# Patient Record
Sex: Male | Born: 1975
Health system: Southern US, Community
[De-identification: ages and names within clinical notes are randomized; demographics above are authoritative.]

## PROBLEM LIST (undated history)

## (undated) DIAGNOSIS — E119 Type 2 diabetes mellitus without complications: Secondary | ICD-10-CM

---

## 2019-02-26 ENCOUNTER — Emergency Department (HOSPITAL_COMMUNITY): Payer: Medicare PPO

## 2019-02-26 ENCOUNTER — Emergency Department (HOSPITAL_COMMUNITY)
Admission: EM | Admit: 2019-02-26 | Discharge: 2019-02-26 | Disposition: A | Discharge status: Home / Self Care | Payer: Medicare PPO | Attending: Emergency Medicine | Admitting: Emergency Medicine

## 2019-02-26 ENCOUNTER — Encounter (HOSPITAL_COMMUNITY): Payer: Self-pay | Admitting: Emergency Medicine

## 2019-02-26 ENCOUNTER — Other Ambulatory Visit: Payer: Self-pay

## 2019-02-26 DIAGNOSIS — Y9289 Other specified places as the place of occurrence of the external cause: Secondary | ICD-10-CM | POA: Diagnosis not present

## 2019-02-26 DIAGNOSIS — W19XXXA Unspecified fall, initial encounter: Secondary | ICD-10-CM

## 2019-02-26 DIAGNOSIS — Y9301 Activity, walking, marching and hiking: Secondary | ICD-10-CM | POA: Insufficient documentation

## 2019-02-26 DIAGNOSIS — S6392XA Sprain of unspecified part of left wrist and hand, initial encounter: Secondary | ICD-10-CM | POA: Diagnosis not present

## 2019-02-26 DIAGNOSIS — W010XXA Fall on same level from slipping, tripping and stumbling without subsequent striking against object, initial encounter: Secondary | ICD-10-CM | POA: Diagnosis not present

## 2019-02-26 DIAGNOSIS — Y998 Other external cause status: Secondary | ICD-10-CM | POA: Insufficient documentation

## 2019-02-26 DIAGNOSIS — S63502A Unspecified sprain of left wrist, initial encounter: Secondary | ICD-10-CM

## 2019-02-26 DIAGNOSIS — S6992XA Unspecified injury of left wrist, hand and finger(s), initial encounter: Secondary | ICD-10-CM | POA: Diagnosis present

## 2019-02-26 MED ORDER — IBUPROFEN 400 MG PO TABS
600.0000 mg | ORAL_TABLET | Freq: Once | ORAL | Status: AC
  Administered 2019-02-26: 600 mg via ORAL
  Filled 2019-02-26: qty 1

## 2019-02-26 MED ORDER — NAPROXEN 500 MG PO TABS: 500.0000 mg | ORAL_TABLET | Freq: Two times a day (BID) | ORAL | 0 refills | Status: DC

## 2019-02-26 NOTE — ED Notes (Signed)
Discharge instructions discussed with Pt. Pt verbalized understanding. Pt stable and ambulatory.    

## 2019-02-26 NOTE — Discharge Instructions (Addendum)
Your x-ray shows now fracture or dislocation of the wrist or hand. Follow up with your doctor or return here as needed for worsening symptoms.

## 2019-02-26 NOTE — ED Provider Notes (Signed)
[facility] EMERGENCY DEPARTMENT Provider Note   CSN: [id number] Arrival date & time: 02/26/19  1458    History   Chief Complaint Chief Complaint  Patient presents with  . Wrist Pain    HPI Mathew Wallace is a 43 y.o. male who presents to the ED with c/o left hand and wrist pain. Patient reports he was walking and didn't see a small hole and fell and landed on his left hand. The injury occurred last night. Patient has not taken anything for pain. He reports that this morning the pain and swelling is worse than it was last night.      The history is provided by the patient. History limited by: sign language. A language interpreter was used.  Wrist Pain  This is a new problem. The current episode started yesterday. The problem occurs constantly. The problem has been gradually worsening. Exacerbated by: movement of wrist. Relieved by: nothing tried. He has tried nothing for the symptoms.    History reviewed. No pertinent past medical history.  There are no active problems to display for this patient.   History reviewed. No pertinent surgical history.      Home Medications    Prior to Admission medications   Medication Sig Start Date End Date Taking? Authorizing Provider  naproxen (NAPROSYN) 500 MG tablet Take 1 tablet (500 mg total) by mouth 2 (two) times daily. 02/26/19   Janne Napoleon, NP    Family History No family history on file.  Social History Social History   Tobacco Use  . Smoking status: Never Smoker  . Smokeless tobacco: Never Used  Substance Use Topics  . Alcohol use: Not Currently  . Drug use: Not Currently     Allergies   Patient has no known allergies.   Review of Systems Review of Systems  Musculoskeletal: Positive for arthralgias and joint swelling.       Wrist pain  All other systems reviewed and are negative.    Physical Exam Updated Vital Signs BP 125/70 (BP Location: Right Arm)   Pulse 95   Temp 100 F (37.8 C)  (Oral)   Resp 18   SpO2 99%   Physical Exam Vitals signs and nursing note reviewed.  Constitutional:      General: He is not in acute distress.    Appearance: He is well-developed.  HENT:     Head: Normocephalic.     Nose: No congestion.     Mouth/Throat:     Mouth: Mucous membranes are moist.  Eyes:     Conjunctiva/sclera: Conjunctivae normal.  Neck:     Musculoskeletal: Neck supple.  Cardiovascular:     Rate and Rhythm: Normal rate.  Pulmonary:     Effort: Pulmonary effort is normal.  Musculoskeletal:     Left wrist: He exhibits tenderness, bony tenderness and swelling. He exhibits no deformity. Decreased range of motion: due to pain.     Left hand: He exhibits tenderness and swelling. He exhibits normal capillary refill, no deformity and no laceration. Normal sensation noted. Normal strength noted. He exhibits no thumb/finger opposition.  Skin:    General: Skin is warm and dry.  Neurological:     Mental Status: He is alert and oriented to person, place, and time.  Psychiatric:        Mood and Affect: Mood normal.      ED Treatments / Results  Labs (all labs ordered are listed, but only abnormal results are displayed) Labs Reviewed -  No data to display Radiology Dg Wrist Complete Left  Result Date: 02/26/2019 CLINICAL DATA:  43 year old male status post fall landing on the left hand. Left hand pain and swelling. EXAM: LEFT WRIST - COMPLETE 3+ VIEW COMPARISON:  Concurrently obtained radiographs of the left hand FINDINGS: Diffuse soft tissue swelling about the hand and wrist. No acute fracture or malalignment identified. Minimal degenerative change present at the thumb [facility] joint. IMPRESSION: Soft tissue swelling without fracture or malalignment identified. Mild CMC joint osteoarthritis. Electronically Signed   By: Malachy Moan M.D.   On: 02/26/2019 16:54   Dg Hand Complete Left  Result Date: 02/26/2019 CLINICAL DATA:  43 year old male with left hand and wrist pain  after suffering a fall EXAM: LEFT HAND - COMPLETE 3+ VIEW COMPARISON:  Concurrently obtained radiographs of the wrist FINDINGS: Extensive soft tissue swelling over the dorsal aspect of the hand. No fracture or malalignment identified. Very mild degenerative change present at the thumb [facility] joint. IMPRESSION: 1. Diffuse soft tissue swelling without acute fracture or malalignment. 2. 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 Electronically Signed   By: Malachy Moan M.D.   On: 02/26/2019 16:55    Procedures Procedures (including critical care time)  Medications Ordered in ED Medications  ibuprofen (ADVIL,MOTRIN) tablet 600 mg (600 mg Oral Given 02/26/19 1628)     Initial Impression / Assessment and Plan / ED Course  I have reviewed the triage vital signs and the nursing notes. 43 y.o. male here with left wrist and hand pain s/p fall stable for d/c without fracture or dislocation noted on x-ray. No focal neuro deficits. Wrist splint applied, ice, elevation, pain management. Return precautions discussed.   Final Clinical Impressions(s) / ED Diagnoses   Final diagnoses:  Left wrist sprain, initial encounter  Fall, initial encounter    ED Discharge Orders         Ordered    naproxen (NAPROSYN) 500 MG tablet  2 times daily     02/26/19 1712           Kerrie Buffalo [place], [place] 02/26/19 1715    Geoffery Lyons, MD 03/03/19 2328

## 2019-02-26 NOTE — ED Triage Notes (Signed)
Pt states that he fell and landed on his left hand and is having pain and swelling to his left wrist and hand. Sign language interpreter used for triage

## 2019-03-06 ENCOUNTER — Emergency Department (HOSPITAL_COMMUNITY)
Admission: EM | Admit: 2019-03-06 | Discharge: 2019-03-06 | Disposition: A | Discharge status: Home / Self Care | Payer: Medicare PPO | Attending: Emergency Medicine | Admitting: Emergency Medicine

## 2019-03-06 ENCOUNTER — Other Ambulatory Visit: Payer: Self-pay

## 2019-03-06 ENCOUNTER — Emergency Department (HOSPITAL_COMMUNITY): Payer: Medicare PPO

## 2019-03-06 DIAGNOSIS — Z79899 Other long term (current) drug therapy: Secondary | ICD-10-CM | POA: Insufficient documentation

## 2019-03-06 DIAGNOSIS — M5442 Lumbago with sciatica, left side: Secondary | ICD-10-CM

## 2019-03-06 DIAGNOSIS — M79605 Pain in left leg: Secondary | ICD-10-CM | POA: Diagnosis present

## 2019-03-06 MED ORDER — IBUPROFEN 800 MG PO TABS: 800.0000 mg | ORAL_TABLET | Freq: Three times a day (TID) | ORAL | 0 refills | Status: AC | PRN

## 2019-03-06 MED ORDER — IBUPROFEN 400 MG PO TABS
600.0000 mg | ORAL_TABLET | Freq: Once | ORAL | Status: AC
  Administered 2019-03-06: 600 mg via ORAL
  Filled 2019-03-06: qty 1

## 2019-03-06 MED ORDER — OXYCODONE-ACETAMINOPHEN 5-325 MG PO TABS
1.0000 | ORAL_TABLET | Freq: Once | ORAL | Status: AC
  Administered 2019-03-06: 1 via ORAL
  Filled 2019-03-06: qty 1

## 2019-03-06 MED ORDER — PREDNISONE 20 MG PO TABS: 60.0000 mg | ORAL_TABLET | Freq: Every day | ORAL | 0 refills | Status: DC

## 2019-03-06 MED ORDER — PREDNISONE 20 MG PO TABS
60.0000 mg | ORAL_TABLET | Freq: Once | ORAL | Status: AC
  Administered 2019-03-06: 60 mg via ORAL
  Filled 2019-03-06: qty 3

## 2019-03-06 NOTE — Discharge Instructions (Addendum)
You were seen in the emergency department for low back pain radiating down your left leg.  Your x-rays did not show any obvious fractures.  This is likely sciatica or pinched nerve that is causing her pain.  We are prescribing you some anti-inflammatory medicine to see if this will help limit your symptoms.  You will be important that you follow-up with a primary care doctor and please return to the emergency department if any worsening pain or further neurologic symptoms.

## 2019-03-06 NOTE — ED Triage Notes (Addendum)
Pt c/o left lower back pain radiating to the left leg that began  2 days ago ; pt denies any recent injury ; pt deaf ; pt had a recent fracture to the left arm and was seen for it last week

## 2019-03-06 NOTE — ED Notes (Signed)
Pt going back via [facility]

## 2019-03-06 NOTE — ED Notes (Signed)
Pt ambulated around 50 feet with steady gait and no assistance

## 2019-03-06 NOTE — ED Provider Notes (Signed)
[facility] EMERGENCY DEPARTMENT Provider Note   CSN: [id number] Arrival date & time: 03/06/19  1443    History   Chief Complaint Chief Complaint  Patient presents with  . Leg Pain    HPI Mathew Wallace is a 43 y.o. male.  Is a history of deafness.  He was here on the 18th after a fall the night before.  He injured his left hand and wrist.  Starting a couple of days ago his left leg gave out on him and he collapsed to the ground.  His back is been hurting since then radiating down the posterior side of his left leg down to the sole of his foot.  That caused him difficulty walking.  He is tried nothing for this.  Says it never happened before but then he does remember that he had a pinched nerve at one point that was down his left leg.  No headache fevers chills nausea vomiting diarrhea.  No incontinence of bowel or bladder.  No numbness.     The history is provided by the patient.  Back Pain  Location:  Lumbar spine Quality:  Aching Radiates to:  L posterior upper leg and L foot Pain severity:  Moderate Pain is:  Same all the time Onset quality:  Gradual Timing:  Constant Progression:  Unchanged Chronicity:  New Context: falling and recent injury   Relieved by:  None tried Worsened by:  Ambulation Ineffective treatments:  Bed rest Associated symptoms: leg pain   Associated symptoms: no abdominal pain, no bladder incontinence, no bowel incontinence, no chest pain, no dysuria, no fever, no numbness, no paresthesias, no perianal numbness, no tingling and no weakness     No past medical history on file.  There are no active problems to display for this patient.   No past surgical history on file.      Home Medications    Prior to Admission medications   Medication Sig Start Date End Date Taking? Authorizing Provider  naproxen (NAPROSYN) 500 MG tablet Take 1 tablet (500 mg total) by mouth 2 (two) times daily. 02/26/19   Janne Napoleon, NP    Family History  No family history on file.  Social History Social History   Tobacco Use  . Smoking status: Never Smoker  . Smokeless tobacco: Never Used  Substance Use Topics  . Alcohol use: Not Currently  . Drug use: Not Currently     Allergies   Patient has no known allergies.   Review of Systems Review of Systems  Constitutional: Negative for fever.  HENT: Positive for hearing loss. Negative for sore throat.   Eyes: Negative for visual disturbance.  Respiratory: Negative for shortness of breath.   Cardiovascular: Negative for chest pain.  Gastrointestinal: Negative for abdominal pain and bowel incontinence.  Genitourinary: Negative for bladder incontinence and dysuria.  Musculoskeletal: Positive for back pain.  Skin: Negative for rash.  Neurological: Negative for tingling, weakness, numbness and paresthesias.     Physical Exam Updated Vital Signs BP 137/83   Pulse 96   Temp 99.1 F (37.3 C) (Oral)   Resp 17   Ht 5\' 11"  (1.803 m)   Wt 97.5 kg   SpO2 99%   BMI 29.99 kg/m   Physical Exam Vitals signs and nursing note reviewed.  Constitutional:      Appearance: He is well-developed.  HENT:     Head: Normocephalic and atraumatic.  Eyes:     Conjunctiva/sclera: Conjunctivae normal.  Neck:  Musculoskeletal: Neck supple.  Cardiovascular:     Rate and Rhythm: Normal rate and regular rhythm.     Heart sounds: No murmur.  Pulmonary:     Effort: Pulmonary effort is normal. No respiratory distress.     Breath sounds: Normal breath sounds.  Abdominal:     Palpations: Abdomen is soft.     Tenderness: There is no abdominal tenderness.  Musculoskeletal: Normal range of motion.        General: Tenderness present.     Comments: He is got some tenderness of his midline and diffuse paralumbar tenderness no step-offs.. He is nontender at his hip knee or ankle.  He is got normal flexion and extension of his foot.  Sensation intact to light touch.  He has pain and limitations when  he tries to flex at his hip.  This aggravates his back pain.  Skin:    General: Skin is warm and dry.     Capillary Refill: Capillary refill takes less than 2 seconds.  Neurological:     General: No focal deficit present.     Mental Status: He is alert and oriented to person, place, and time. Mental status is at baseline.     Sensory: No sensory deficit.     Motor: No weakness.     Gait: Gait abnormal.      ED Treatments / Results  Labs (all labs ordered are listed, but only abnormal results are displayed) Labs Reviewed - No data to display  EKG None  Radiology Dg Lumbar Spine Complete  Result Date: 03/06/2019 CLINICAL DATA:  Recent fall with low back pain, initial encounter EXAM: LUMBAR SPINE - COMPLETE 4+ VIEW COMPARISON:  None. FINDINGS: Five lumbar type vertebral bodies are well visualized. There is a partially lumbarized first sacral segment. No pars defects are noted. No anterolisthesis is seen. Mild disc space narrowing at L4-5 and L5-S1 is noted. No acute soft tissue abnormality is noted. IMPRESSION: Mild degenerative changes of lumbar spine without acute abnormality Electronically Signed   By: Alcide CleverMark  Lukens M.D.   On: 03/06/2019 16:20    Procedures Procedures (including critical care time)  Medications Ordered in ED Medications  oxyCODONE-acetaminophen (PERCOCET/ROXICET) 5-325 MG per tablet 1 tablet (has no administration in time range)  ibuprofen (ADVIL,MOTRIN) tablet 600 mg (has no administration in time range)  predniSONE (DELTASONE) tablet 60 mg (has no administration in time range)     Initial Impression / Assessment and Plan / ED Course  I have reviewed the triage vital signs and the nursing notes.  Pertinent labs & imaging results that were available during my care of the patient were reviewed by me and considered in my medical decision making (see chart for details).  Clinical Course as of Mar 06 2227  Sat Mar 06, 2019  [id number] 43 year old male with deafness  here with low back pain and pain radiating down his left leg leading to some weakness.  I do not appreciate any real weakness on exam but pain does make him give way.  No numbness no cauda equina signs.  We will treat him with some anti-inflammatories and pain medicine and check some imaging.   [MB]  1600 History and exam assisted by ASL interpreter on iPad.   [MB]  1623 Patient's imaging shows DJD but no acute fractures.  Will attempt to ambulate.   [MB]  1727 Patient did fairly well ambulating.  Will discharge on pain medicine NSAIDs and steroids and have follow-up PCP.   [MB]  Clinical Course User Index [MB] Terrilee Files, MD        Final Clinical Impressions(s) / ED Diagnoses   Final diagnoses:  Acute midline low back pain with left-sided sciatica    ED Discharge Orders         Ordered    ibuprofen (ADVIL,MOTRIN) 800 MG tablet  Every 8 hours PRN     03/06/19 1729    predniSONE (DELTASONE) 20 MG tablet  Daily     03/06/19 1729           Terrilee Files, MD 03/06/19 2229

## 2019-03-09 MED FILL — IBUPROFEN 800 MG TAB: 800 | 10 days supply | Qty: 30 | Fill #0

## 2019-03-09 MED FILL — NAPROXEN 500 MG TABLET: 500 | 10 days supply | Qty: 20 | Fill #0

## 2019-03-09 MED FILL — predniSONE 20 MG TABS: 20 | 4 days supply | Qty: 12 | Fill #0

## 2019-03-10 ENCOUNTER — Other Ambulatory Visit: Payer: Self-pay | Admitting: Critical Care Medicine

## 2019-03-21 ENCOUNTER — Emergency Department (HOSPITAL_COMMUNITY): Payer: Medicare PPO

## 2019-03-21 ENCOUNTER — Inpatient Hospital Stay (HOSPITAL_COMMUNITY)
Admission: EM | Admit: 2019-03-21 | Discharge: 2019-03-23 | DRG: 638 | Disposition: A | Discharge status: Home / Self Care | Payer: Medicare PPO | Attending: Cardiovascular Disease | Admitting: Cardiovascular Disease

## 2019-03-21 DIAGNOSIS — E1165 Type 2 diabetes mellitus with hyperglycemia: Principal | ICD-10-CM | POA: Diagnosis present

## 2019-03-21 DIAGNOSIS — R0789 Other chest pain: Secondary | ICD-10-CM

## 2019-03-21 DIAGNOSIS — Z833 Family history of diabetes mellitus: Secondary | ICD-10-CM

## 2019-03-21 DIAGNOSIS — I249 Acute ischemic heart disease, unspecified: Secondary | ICD-10-CM | POA: Diagnosis present

## 2019-03-21 DIAGNOSIS — Z590 Homelessness: Secondary | ICD-10-CM

## 2019-03-21 DIAGNOSIS — R918 Other nonspecific abnormal finding of lung field: Secondary | ICD-10-CM | POA: Diagnosis present

## 2019-03-21 DIAGNOSIS — Z91128 Patient's intentional underdosing of medication regimen for other reason: Secondary | ICD-10-CM

## 2019-03-21 DIAGNOSIS — R079 Chest pain, unspecified: Secondary | ICD-10-CM

## 2019-03-21 DIAGNOSIS — R590 Localized enlarged lymph nodes: Secondary | ICD-10-CM | POA: Diagnosis present

## 2019-03-21 DIAGNOSIS — Z7952 Long term (current) use of systemic steroids: Secondary | ICD-10-CM

## 2019-03-21 DIAGNOSIS — T383X6A Underdosing of insulin and oral hypoglycemic [antidiabetic] drugs, initial encounter: Secondary | ICD-10-CM | POA: Diagnosis present

## 2019-03-21 DIAGNOSIS — R778 Other specified abnormalities of plasma proteins: Secondary | ICD-10-CM

## 2019-03-21 DIAGNOSIS — Z791 Long term (current) use of non-steroidal anti-inflammatories (NSAID): Secondary | ICD-10-CM

## 2019-03-21 DIAGNOSIS — H9190 Unspecified hearing loss, unspecified ear: Secondary | ICD-10-CM | POA: Diagnosis present

## 2019-03-21 DIAGNOSIS — R7989 Other specified abnormal findings of blood chemistry: Secondary | ICD-10-CM

## 2019-03-21 DIAGNOSIS — X58XXXA Exposure to other specified factors, initial encounter: Secondary | ICD-10-CM | POA: Diagnosis present

## 2019-03-21 DIAGNOSIS — T464X6A Underdosing of angiotensin-converting-enzyme inhibitors, initial encounter: Secondary | ICD-10-CM | POA: Diagnosis present

## 2019-03-21 DIAGNOSIS — I213 ST elevation (STEMI) myocardial infarction of unspecified site: Secondary | ICD-10-CM

## 2019-03-21 HISTORY — DX: Type 2 diabetes mellitus without complications: E11.9

## 2019-03-21 MED ORDER — ASPIRIN 81 MG PO CHEW: 324.0000 mg | CHEWABLE_TABLET | Freq: Once | ORAL | Status: DC

## 2019-03-21 MED ORDER — SODIUM CHLORIDE 0.9 % IV SOLN
INTRAVENOUS | Status: DC
  Administered 2019-03-21: via INTRAVENOUS

## 2019-03-21 MED ORDER — MORPHINE SULFATE (PF) 4 MG/ML IV SOLN
4.0000 mg | Freq: Once | INTRAVENOUS | Status: AC
  Administered 2019-03-21: 4 mg via INTRAVENOUS
  Filled 2019-03-21: qty 1

## 2019-03-21 MED ORDER — HEPARIN SODIUM (PORCINE) 5000 UNIT/ML IJ SOLN
4000.0000 [IU] | Freq: Once | INTRAMUSCULAR | Status: AC
  Administered 2019-03-21: 4000 [IU] via INTRAVENOUS

## 2019-03-22 ENCOUNTER — Inpatient Hospital Stay (HOSPITAL_COMMUNITY): Payer: Medicare PPO

## 2019-03-22 ENCOUNTER — Encounter (HOSPITAL_COMMUNITY): Payer: Self-pay | Admitting: Emergency Medicine

## 2019-03-22 ENCOUNTER — Other Ambulatory Visit: Payer: Self-pay

## 2019-03-22 ENCOUNTER — Inpatient Hospital Stay (HOSPITAL_COMMUNITY)
Admission: EM | Disposition: A | Discharge status: Home / Self Care | Payer: Self-pay | Source: Home / Self Care | Attending: Cardiovascular Disease

## 2019-03-22 DIAGNOSIS — T383X6A Underdosing of insulin and oral hypoglycemic [antidiabetic] drugs, initial encounter: Secondary | ICD-10-CM | POA: Diagnosis present

## 2019-03-22 DIAGNOSIS — T464X6A Underdosing of angiotensin-converting-enzyme inhibitors, initial encounter: Secondary | ICD-10-CM | POA: Diagnosis present

## 2019-03-22 DIAGNOSIS — R7989 Other specified abnormal findings of blood chemistry: Secondary | ICD-10-CM

## 2019-03-22 DIAGNOSIS — R9431 Abnormal electrocardiogram [ECG] [EKG]: Secondary | ICD-10-CM

## 2019-03-22 DIAGNOSIS — Z791 Long term (current) use of non-steroidal anti-inflammatories (NSAID): Secondary | ICD-10-CM | POA: Diagnosis not present

## 2019-03-22 DIAGNOSIS — Z590 Homelessness: Secondary | ICD-10-CM | POA: Diagnosis not present

## 2019-03-22 DIAGNOSIS — R918 Other nonspecific abnormal finding of lung field: Secondary | ICD-10-CM

## 2019-03-22 DIAGNOSIS — Z91128 Patient's intentional underdosing of medication regimen for other reason: Secondary | ICD-10-CM | POA: Diagnosis not present

## 2019-03-22 DIAGNOSIS — Z7952 Long term (current) use of systemic steroids: Secondary | ICD-10-CM | POA: Diagnosis not present

## 2019-03-22 DIAGNOSIS — R0789 Other chest pain: Secondary | ICD-10-CM | POA: Diagnosis not present

## 2019-03-22 DIAGNOSIS — H9190 Unspecified hearing loss, unspecified ear: Secondary | ICD-10-CM | POA: Diagnosis present

## 2019-03-22 DIAGNOSIS — I249 Acute ischemic heart disease, unspecified: Secondary | ICD-10-CM | POA: Diagnosis present

## 2019-03-22 DIAGNOSIS — X58XXXA Exposure to other specified factors, initial encounter: Secondary | ICD-10-CM | POA: Diagnosis present

## 2019-03-22 DIAGNOSIS — Z833 Family history of diabetes mellitus: Secondary | ICD-10-CM | POA: Diagnosis not present

## 2019-03-22 DIAGNOSIS — R590 Localized enlarged lymph nodes: Secondary | ICD-10-CM | POA: Diagnosis present

## 2019-03-22 DIAGNOSIS — R079 Chest pain, unspecified: Secondary | ICD-10-CM | POA: Diagnosis present

## 2019-03-22 DIAGNOSIS — R778 Other specified abnormalities of plasma proteins: Secondary | ICD-10-CM

## 2019-03-22 DIAGNOSIS — E1165 Type 2 diabetes mellitus with hyperglycemia: Principal | ICD-10-CM | POA: Diagnosis present

## 2019-03-22 HISTORY — PX: LEFT HEART CATH AND CORONARY ANGIOGRAPHY: CATH118249

## 2019-03-22 LAB — APTT: aPTT: 32 seconds (ref 24–36)

## 2019-03-22 LAB — CBC WITH DIFFERENTIAL/PLATELET
Abs Immature Granulocytes: 0.03 10*3/uL (ref 0.00–0.07)
Basophils Absolute: 0 10*3/uL (ref 0.0–0.1)
Basophils Relative: 0 %
Eosinophils Absolute: 0.1 10*3/uL (ref 0.0–0.5)
Eosinophils Relative: 2 %
HCT: 31.8 % — ABNORMAL LOW (ref 39.0–52.0)
Hemoglobin: 10.4 g/dL — ABNORMAL LOW (ref 13.0–17.0)
Immature Granulocytes: 0 %
Lymphocytes Relative: 29 %
Lymphs Abs: 2 10*3/uL (ref 0.7–4.0)
MCH: 25.7 pg — ABNORMAL LOW (ref 26.0–34.0)
MCHC: 32.7 g/dL (ref 30.0–36.0)
MCV: 78.7 fL — ABNORMAL LOW (ref 80.0–100.0)
Monocytes Absolute: 0.5 10*3/uL (ref 0.1–1.0)
Monocytes Relative: 7 %
Neutro Abs: 4.4 10*3/uL (ref 1.7–7.7)
Neutrophils Relative %: 62 %
Platelets: 325 10*3/uL (ref 150–400)
RBC: 4.04 MIL/uL — ABNORMAL LOW (ref 4.22–5.81)
RDW: 13.7 % (ref 11.5–15.5)
WBC: 7.1 10*3/uL (ref 4.0–10.5)
nRBC: 0 % (ref 0.0–0.2)

## 2019-03-22 LAB — HEMOGLOBIN A1C
Hgb A1c MFr Bld: 11.1 % — ABNORMAL HIGH (ref 4.8–5.6)
Mean Plasma Glucose: 271.87 mg/dL

## 2019-03-22 LAB — COMPREHENSIVE METABOLIC PANEL
ALT: 16 U/L (ref 0–44)
AST: 19 U/L (ref 15–41)
Albumin: 2.5 g/dL — ABNORMAL LOW (ref 3.5–5.0)
Alkaline Phosphatase: 68 U/L (ref 38–126)
Anion gap: 12 (ref 5–15)
BUN: 21 mg/dL — ABNORMAL HIGH (ref 6–20)
CO2: 24 mmol/L (ref 22–32)
Calcium: 8.7 mg/dL — ABNORMAL LOW (ref 8.9–10.3)
Chloride: 93 mmol/L — ABNORMAL LOW (ref 98–111)
Creatinine, Ser: 1.05 mg/dL (ref 0.61–1.24)
GFR calc Af Amer: >60 mL/min (ref >60)
GFR calc non Af Amer: >60 mL/min (ref >60)
Glucose, Bld: 348 mg/dL — ABNORMAL HIGH (ref 70–99)
Potassium: 3.8 mmol/L (ref 3.5–5.1)
Sodium: 129 mmol/L — ABNORMAL LOW (ref 135–145)
Total Bilirubin: 0.4 mg/dL (ref 0.3–1.2)
Total Protein: 7.6 g/dL (ref 6.5–8.1)

## 2019-03-22 LAB — RAPID URINE DRUG SCREEN, HOSP PERFORMED
Amphetamines: NOT DETECTED
Barbiturates: NOT DETECTED
Benzodiazepines: NOT DETECTED
Cocaine: NOT DETECTED
Opiates: POSITIVE — AB
Tetrahydrocannabinol: NOT DETECTED

## 2019-03-22 LAB — LIPID PANEL
Cholesterol: 179 mg/dL (ref 0–200)
HDL: 45 mg/dL (ref >40)
LDL Cholesterol: 113 mg/dL — ABNORMAL HIGH (ref 0–99)
Total CHOL/HDL Ratio: 4 RATIO
Triglycerides: 107 mg/dL (ref <150)
VLDL: 21 mg/dL (ref 0–40)

## 2019-03-22 LAB — ECHOCARDIOGRAM COMPLETE
Height: 71 in
Weight: 3200 oz

## 2019-03-22 LAB — PROTIME-INR
INR: 1.2 (ref 0.8–1.2)
Prothrombin Time: 14.6 seconds (ref 11.4–15.2)

## 2019-03-22 LAB — TROPONIN I
Troponin I: 0.14 ng/mL (ref <0.03)
Troponin I: 0.15 ng/mL (ref <0.03)

## 2019-03-22 LAB — POCT I-STAT CREATININE: Creatinine, Ser: 0.7 mg/dL (ref 0.61–1.24)

## 2019-03-22 LAB — D-DIMER, QUANTITATIVE: D-Dimer, Quant: 1.28 ug/mL-FEU — ABNORMAL HIGH (ref 0.00–0.50)

## 2019-03-22 LAB — GLUCOSE, CAPILLARY
Glucose-Capillary: 365 mg/dL — ABNORMAL HIGH (ref 70–99)
Glucose-Capillary: 310 mg/dL — ABNORMAL HIGH (ref 70–99)
Glucose-Capillary: 266 mg/dL — ABNORMAL HIGH (ref 70–99)

## 2019-03-22 LAB — T4, FREE: Free T4: 1.27 ng/dL (ref 0.82–1.77)

## 2019-03-22 LAB — HIV ANTIBODY (ROUTINE TESTING W REFLEX): HIV Screen 4th Generation wRfx: NONREACTIVE

## 2019-03-22 LAB — TSH: TSH: 3.506 u[IU]/mL (ref 0.350–4.500)

## 2019-03-22 SURGERY — LEFT HEART CATH AND CORONARY ANGIOGRAPHY
Anesthesia: LOCAL

## 2019-03-22 MED ORDER — HEPARIN SODIUM (PORCINE) 5000 UNIT/ML IJ SOLN
5000.0000 [IU] | Freq: Three times a day (TID) | INTRAMUSCULAR | Status: DC
  Administered 2019-03-22: 22:00:00 5000 [IU] via SUBCUTANEOUS
  Filled 2019-03-22: qty 1

## 2019-03-22 MED ORDER — ONDANSETRON HCL 4 MG/2ML IJ SOLN: 4.0000 mg | Freq: Four times a day (QID) | INTRAMUSCULAR | Status: DC | PRN

## 2019-03-22 MED ORDER — INSULIN ASPART 100 UNIT/ML ~~LOC~~ SOLN
0.0000 [IU] | Freq: Three times a day (TID) | SUBCUTANEOUS | Status: DC
  Administered 2019-03-22: 7 [IU] via SUBCUTANEOUS
  Administered 2019-03-22: 9 [IU] via SUBCUTANEOUS
  Administered 2019-03-23: 2 [IU] via SUBCUTANEOUS

## 2019-03-22 MED ORDER — IOHEXOL 350 MG/ML SOLN
75.0000 mL | Freq: Once | INTRAVENOUS | Status: AC | PRN
  Administered 2019-03-22: 06:00:00 75 mL via INTRAVENOUS

## 2019-03-22 MED ORDER — SODIUM CHLORIDE 0.9 % IV SOLN: INTRAVENOUS | Status: DC

## 2019-03-22 MED ORDER — SODIUM CHLORIDE 0.9 % IV SOLN: 250.0000 mL | INTRAVENOUS | Status: DC | PRN

## 2019-03-22 MED ORDER — LIDOCAINE HCL (PF) 1 % IJ SOLN
INTRAMUSCULAR | Status: AC
  Filled 2019-03-22: qty 30

## 2019-03-22 MED ORDER — IOHEXOL 350 MG/ML SOLN
INTRAVENOUS | Status: DC | PRN
  Administered 2019-03-22: 105 mL via INTRAVENOUS

## 2019-03-22 MED ORDER — HEPARIN (PORCINE) IN NACL 1000-0.9 UT/500ML-% IV SOLN
INTRAVENOUS | Status: DC | PRN
  Administered 2019-03-22 (×2): 500 mL

## 2019-03-22 MED ORDER — ASPIRIN EC 81 MG PO TBEC
81.0000 mg | DELAYED_RELEASE_TABLET | Freq: Every day | ORAL | Status: DC
  Administered 2019-03-23: 81 mg via ORAL
  Filled 2019-03-22: qty 1

## 2019-03-22 MED ORDER — HEPARIN SODIUM (PORCINE) 1000 UNIT/ML IJ SOLN
INTRAMUSCULAR | Status: AC
  Filled 2019-03-22: qty 1

## 2019-03-22 MED ORDER — INSULIN NPH (HUMAN) (ISOPHANE) 100 UNIT/ML ~~LOC~~ SUSP
6.0000 [IU] | Freq: Two times a day (BID) | SUBCUTANEOUS | Status: DC
  Administered 2019-03-22 – 2019-03-23 (×2): 6 [IU] via SUBCUTANEOUS
  Filled 2019-03-22: qty 10

## 2019-03-22 MED ORDER — SODIUM CHLORIDE 0.9% FLUSH
3.0000 mL | Freq: Two times a day (BID) | INTRAVENOUS | Status: DC
  Administered 2019-03-22: 3 mL via INTRAVENOUS

## 2019-03-22 MED ORDER — INSULIN ASPART 100 UNIT/ML ~~LOC~~ SOLN
0.0000 [IU] | Freq: Every day | SUBCUTANEOUS | Status: DC
  Administered 2019-03-22: 3 [IU] via SUBCUTANEOUS

## 2019-03-22 MED ORDER — ASPIRIN 81 MG PO CHEW: 81.0000 mg | CHEWABLE_TABLET | Freq: Every day | ORAL | Status: DC

## 2019-03-22 MED ORDER — LABETALOL HCL 5 MG/ML IV SOLN: 10.0000 mg | INTRAVENOUS | Status: AC | PRN

## 2019-03-22 MED ORDER — VERAPAMIL HCL 2.5 MG/ML IV SOLN
INTRAVENOUS | Status: DC | PRN
  Administered 2019-03-22: via INTRA_ARTERIAL

## 2019-03-22 MED ORDER — VERAPAMIL HCL 2.5 MG/ML IV SOLN
INTRAVENOUS | Status: AC
  Filled 2019-03-22: qty 2

## 2019-03-22 MED ORDER — ATORVASTATIN CALCIUM 40 MG PO TABS
40.0000 mg | ORAL_TABLET | Freq: Every day | ORAL | Status: DC
  Administered 2019-03-22: 17:00:00 40 mg via ORAL
  Filled 2019-03-22: qty 1

## 2019-03-22 MED ORDER — LIDOCAINE HCL (PF) 1 % IJ SOLN
INTRAMUSCULAR | Status: DC | PRN
  Administered 2019-03-22: 2 mL via SUBCUTANEOUS

## 2019-03-22 MED ORDER — SODIUM CHLORIDE 0.9% FLUSH: 3.0000 mL | INTRAVENOUS | Status: DC | PRN

## 2019-03-22 MED ORDER — NITROGLYCERIN 0.4 MG SL SUBL: 0.4000 mg | SUBLINGUAL_TABLET | SUBLINGUAL | Status: DC | PRN

## 2019-03-22 MED ORDER — ACETAMINOPHEN 325 MG PO TABS: 650.0000 mg | ORAL_TABLET | ORAL | Status: DC | PRN

## 2019-03-22 MED ORDER — NITROGLYCERIN 1 MG/10 ML FOR IR/CATH LAB
INTRA_ARTERIAL | Status: AC
  Filled 2019-03-22: qty 10

## 2019-03-22 MED ORDER — HEPARIN (PORCINE) IN NACL 1000-0.9 UT/500ML-% IV SOLN
INTRAVENOUS | Status: AC
  Filled 2019-03-22: qty 1000

## 2019-03-22 MED ORDER — METOPROLOL TARTRATE 12.5 MG HALF TABLET
12.5000 mg | ORAL_TABLET | Freq: Two times a day (BID) | ORAL | Status: DC
  Administered 2019-03-22 – 2019-03-23 (×3): 12.5 mg via ORAL
  Filled 2019-03-22 (×3): qty 1

## 2019-03-22 MED ORDER — HYDRALAZINE HCL 20 MG/ML IJ SOLN: 10.0000 mg | INTRAMUSCULAR | Status: AC | PRN

## 2019-03-22 MED ORDER — ATORVASTATIN CALCIUM 40 MG PO TABS: 40.0000 mg | ORAL_TABLET | Freq: Every day | ORAL | Status: DC

## 2019-03-22 MED ORDER — LIVING WELL WITH DIABETES BOOK: Freq: Once | Status: DC

## 2019-03-22 SURGICAL SUPPLY — 13 items
CATH INFINITI 5FR ANG PIGTAIL (CATHETERS) ×2 IMPLANT
CATH OPTITORQUE TIG 4.0 5F (CATHETERS) ×2 IMPLANT
CATH VISTA GUIDE 6FR JR4 (CATHETERS) ×2 IMPLANT
DEVICE RAD COMP TR BAND LRG (VASCULAR PRODUCTS) ×2 IMPLANT
GLIDESHEATH SLEND SS 6F .021 (SHEATH) ×4 IMPLANT
GUIDEWIRE INQWIRE 1.5J.035X260 (WIRE) ×1 IMPLANT
INQWIRE 1.5J .035X260CM (WIRE) ×2
KIT ENCORE 26 ADVANTAGE (KITS) ×2 IMPLANT
KIT HEART LEFT (KITS) ×2 IMPLANT
PACK CARDIAC CATHETERIZATION (CUSTOM PROCEDURE TRAY) ×2 IMPLANT
SYR MEDRAD MARK 7 150ML (SYRINGE) ×2 IMPLANT
TRANSDUCER W/STOPCOCK (MISCELLANEOUS) ×2 IMPLANT
TUBING CIL FLEX 10 FLL-RA (TUBING) ×2 IMPLANT

## 2019-03-22 NOTE — ED Notes (Signed)
Report given to cath lab. 

## 2019-03-22 NOTE — Progress Notes (Signed)
43 year old male cardiology requested consultation regarding diabetes management.  Patient has been seen by diabetes educator who recommended insulin NPH 6 units twice daily and for the patient to be supplied with some needles syringes and alcohol pads.  She has insurance and case management is coordinating through their case management to obtain a primary care physician for the patient.  He has other chronic problems please see my note which will be dictated soon.  Patient may discharge home on insulin NPH 6 units twice daily.  Please furnish him with needles and syringes and alcohol pads.  Will request that he get a glucometer as well. Have discussed this with the patient who is in agreement I have also discussed with the patient his pulmonary nodules.  Please see my dictated note which will be available in the next few hours. Patient may discharge from the hospital.

## 2019-03-22 NOTE — Consult Note (Signed)
Medical Consultation   Mathew Wallace  WVP:[id number]  DOB: July 02, 1976  DOA: 03/21/2019  PCP: Patient, No Pcp Per   Requesting physician: Nicki Guadalajara, MD: Cardiology  Reason for consultation: Diabetes management and evaluation of abnormal CT of chest  History of Present Illness: Mathew Wallace is an 43 y.o. male deaf and uses sign language for communication.  On admission thought he had no past medical history however he does have a history of diabetes type 2.  He has been off his meds for over a year because he was on metformin 500 mg twice daily and it caused him to lose weight.  He lost approximately 180 pounds and thought that was too much.  He was also on lisinopril which is he stopped over a year ago as well.  He is insured under [employer] which has a case management program and will assign him an outpatient physician.  He was admitted due to chest pain and underwent cardiac catheterization which revealed no coronary disease.  CT of his chest did show Right mediastinal and hilar lymphadenopathy was suspicious for malignancy versus infectious process.  Patient denies any cough, he has had no weight loss since he stopped the metformin, his weight has been stable over the past 1 year.  He has no sputum production.  He denies any fevers or chills.  He has had no nausea or vomiting.  [facility] education has seen the patient and the diabetes nurse recommended patient receive NPH 6 units twice daily.  He also recommended that we give the patient some needles syringes and alcohol pads at discharge. Patient has a grossly elevated hemoglobin A1c at 11.  This is likely because he has not been taking any medications whatsoever.  After discharge he plans to return to [place].   Review of Systems:  As per HPI otherwise 10 point review of systems negative.    Past Medical History: Past Medical History:  Diagnosis Date  . Type 2 diabetes mellitus (HCC)     Past Surgical History:  Past Surgical History:  Procedure Laterality Date  . LEFT HEART CATH AND CORONARY ANGIOGRAPHY N/A 03/22/2019   Procedure: LEFT HEART CATH AND CORONARY ANGIOGRAPHY;  Surgeon: Lennette Bihari, MD;  Location: [facility] INVASIVE CV LAB;  Service: Cardiovascular;  Laterality: N/A;     Allergies:  No Known Allergies   Social History:  reports that he has never smoked. He has never used smokeless tobacco. He reports previous alcohol use. He reports previous drug use.   Family History: Family History  Family history unknown: Yes   Patient states that he believes his parents have diabetes but is unable to tell me anything else.   Physical Exam: Vitals:   03/22/19 0044 03/22/19 0133 03/22/19 0751 03/22/19 1426  BP: (!) 151/94 122/73 131/77 130/77  Pulse: 97 93 94 85  Resp: (!) 37 17  16  Temp:  98.7 F (37.1 C)  98.4 F (36.9 C)  TempSrc:  Oral  Oral  SpO2: 100% 95%  98%  Weight:      Height:        Constitutional: Well-developed well-nourished,  Alert and awake, oriented x3, not in any acute distress. Eyes: PERLA, EOMI, irises appear normal, anicteric sclera,  ENMT: external ears and nose appear normal, deaf            Lips appears normal, oropharynx mucosa, tongue, posterior pharynx appear normal  Neck: neck appears normal, no masses, normal ROM, no thyromegaly, no JVD  CVS: S1-S2 clear, no murmur rubs or gallops, no LE edema, normal pedal pulses  Respiratory:  clear to auscultation bilaterally, no wheezing, rales or rhonchi. Respiratory effort normal. No accessory muscle use.  Abdomen: soft nontender, nondistended, normal bowel sounds, no hepatosplenomegaly, no hernias  Musculoskeletal: : no cyanosis, clubbing or edema noted bilaterally Neuro: Cranial nerves II-XII intact, strength, sensation, reflexes Psych: judgement and insight appear normal, stable mood and affect, mental status Skin: no rashes or lesions or ulcers, no induration or nodules    Data reviewed:  I have personally  reviewed following labs and imaging studies Labs:  CBC: Recent Labs  Lab 03/21/19 2345  WBC 7.1  NEUTROABS 4.4  HGB 10.4*  HCT 31.8*  MCV 78.7*  PLT 325    Basic Metabolic Panel: Recent Labs  Lab 03/21/19 2345 03/22/19 0022  NA 129*  --   K 3.8  --   CL 93*  --   CO2 24  --   GLUCOSE 348*  --   BUN 21*  --   CREATININE 1.05 0.70  CALCIUM 8.7*  --    GFR Estimated Creatinine Clearance: 135.8 mL/min (by C-G formula based on SCr of 0.7 mg/dL). Liver Function Tests: Recent Labs  Lab 03/21/19 2345  AST 19  ALT 16  ALKPHOS 68  BILITOT 0.4  PROT 7.6  ALBUMIN 2.5*   No results for input(s): LIPASE, AMYLASE in the last 168 hours. No results for input(s): AMMONIA in the last 168 hours. Coagulation profile Recent Labs  Lab 03/21/19 2345  INR 1.2    Cardiac Enzymes: Recent Labs  Lab 03/21/19 2345 03/22/19 0818  TROPONINI 0.14* 0.15*   BNP: Invalid input(s): POCBNP CBG: Recent Labs  Lab 03/22/19 1134  GLUCAP 365*   D-Dimer Recent Labs    03/21/19 2345  DDIMER 1.28*   Hgb A1c Recent Labs    03/22/19 0306  HGBA1C 11.1*   Lipid Profile Recent Labs    03/21/19 2345  CHOL 179  HDL 45  LDLCALC 113*  TRIG 107  CHOLHDL 4.0   Thyroid function studies Recent Labs    03/22/19 0306  TSH 3.506    Inpatient Medications:   Scheduled Meds: . aspirin  324 mg Oral Once  . [START ON 03/23/2019] aspirin EC  81 mg Oral Daily  . atorvastatin  40 mg Oral q1800  . heparin  5,000 Units Subcutaneous Q8H  . insulin aspart  0-5 Units Subcutaneous QHS  . insulin aspart  0-9 Units Subcutaneous TID WC  . insulin NPH Human  6 Units Subcutaneous BID AC & HS  . living well with diabetes book   Does not apply Once  . metoprolol tartrate  12.5 mg Oral BID  . sodium chloride flush  3 mL Intravenous Q12H   Continuous Infusions: . sodium chloride 20 mL/hr at 03/22/19 0300  . sodium chloride    . sodium chloride       Radiological Exams on Admission: Ct  Angio Chest Pe W Or Wo Contrast  Result Date: 03/22/2019 CLINICAL DATA:  Follow-up on pulmonary embolism therapy. Per chart, no indication of acute pulmonary embolism, rather there is history of STEMI. EXAM: CT ANGIOGRAPHY CHEST WITH CONTRAST TECHNIQUE: Multidetector CT imaging of the chest was performed using the standard protocol during bolus administration of intravenous contrast. Multiplanar CT image reconstructions and MIPs were obtained to evaluate the vascular anatomy. CONTRAST:  75mL OMNIPAQUE IOHEXOL 350  MG/ML SOLN COMPARISON:  None. FINDINGS: Cardiovascular: Satisfactory opacification of the pulmonary arteries to the segmental level. No evidence of pulmonary embolism. Normal heart size. No pericardial effusion. Mediastinum/Nodes: Right hilar and paratracheal/precarinal adenopathy. Precarinal node measures up to 2 cm in diameter. No visible cavitation in the arterial phase. Lungs/Pleura: Symmetric micronodular appearance in the apically lungs. The largest nodule measures 7 mm right in the right middle lobe. Mild dependent atelectasis. No Kerley lines, effusion, or pneumothorax. Upper Abdomen: No acute finding Musculoskeletal: No acute finding Call is being placed to the ordering provider-see Z vision documentation Review of the MIP images confirms the above findings. IMPRESSION: 1. Negative for pulmonary embolism. 2. Right mediastinal and hilar lymphadenopathy needing malignancy workup but potentially infectious/granulomatous (TB, sarcoid, ect) as there is apically predominant micro nodularity. Electronically Signed   By: Marnee Spring M.D.   On: 03/22/2019 06:46    Impression/Recommendations Principal Problem:   STEMI (ST elevation myocardial infarction) (HCC) Active Problems:   ACS (acute coronary syndrome) (HCC)   Uncontrolled type 2 diabetes mellitus with hyperglycemia (HCC)   Pulmonary nodules  1.  Uncontrolled type 2 diabetes with hyperglycemia: We will start patient on insulin NPH 6  units twice daily.  Recommend patient be given some needles, syringes, and alcohol pads.  He has no access to his finances apparently.  Case management has been consulted with regards to getting the patient a glucometer.  Commend patient check his fingerstick blood glucoses at least twice daily and check in with his primary care practitioner.  [employer] insurance is due to assign him 1 today.  Patient is stable to discharge home once provisions have been made to obtain his medications.  2.  Pulmonary nodules: Patient's weight has been stable over the past 1 year.  He denies any symptoms of TB including recent weight loss, recent fevers, chills, cough, and sputum production.  It is possible that these are reactive pulmonary nodules from another source or due to a malignancy.  These are not acute and can be evaluated as an outpatient.  I have discussed this with the patient who is aware that the pulmonary nodules are present.  His insurance provider is going to get him a primary care provider where he can follow-up and have these looked into.  I have encouraged him to hold onto his discharge papers as his care everywhere registration number will be present on that and primary care provider can use that to obtain his records.  Patient seen with assistance of sign language translator  Thank you for this consultation.  Our [facility] hospitalist team will follow the patient with you.   Time Spent: 90 minutes  Lahoma Crocker M.D. [facility] 03/22/2019, 3:14 PM

## 2019-03-22 NOTE — Progress Notes (Signed)
Inpatient Diabetes Program Recommendations  AACE/ADA: New Consensus Statement on Inpatient Glycemic Control (2015)  Target Ranges:  Prepandial:   less than 140 mg/dL      Peak postprandial:   less than 180 mg/dL (1-2 hours)      Critically ill patients:  140 - 180 mg/dL   Lab Results  Component Value Date   GLUCAP 365 (H) 03/22/2019   HGBA1C 11.1 (H) 03/22/2019    Review of Glycemic Control  Diabetes history: DM 2 Outpatient Diabetes medications: None currently, Metformin in the past Current orders for Inpatient glycemic control: Novolog 0-9 units tid, Novolog 0-5 units qhs  Inpatient Diabetes Program Recommendations:    Met with patient in room with ASL interpreter. Patient has had DM in the past and had been on Metformin. When I reviewed his current A1c with him, 11.1%. Patient reported that it was high needed to be 7% or less. Patient has a strong family history of DM 2 on his maternal side. Patient report he lost a lot of weight and had stopped Metformin about 1 year ago.   When discussing patient's social situation, he came from [place], [place] and had been here for 3 months at a shelter. Patient reports after 3 months they tell you to leave.   Patient also reports he is unable to acces his banking or any kind of funds because he does not have his debit card, all of his banking information is on his phone.  Patient was on his way to the bus stop to go back to [place] when he had CP. Patient also mentioned maybe going to a friend in [place]. Patient has insurance [place]. Even with insurance he does not have money for a copay.  Spoke with patient about CBGs, needing insulin and hyper/hypoglycemia. S/s and treatment for both. Patient reports he has glucose tablets in case of a low.  Recommending NPH BID outpatient instead of 70/30 due to unknown PO intake. Will also recommend ordering in hospital so patient can get hospital vial to take home. Patient will  also need some syringes and alcohol pads from the unit in order to deliver insulin.  Based on inpatient CBG and A1c could consider NPH 6 units BID.  Thanks,  Tama Headings RN, MSN, BC-ADM Inpatient Diabetes Coordinator Team Pager [phone] (8a-5p)

## 2019-03-22 NOTE — Progress Notes (Signed)
Attempted to coordinate American Sign Language interpreter for 03/23/2019 to be present at 11 AM for Dr. Landry Dyke rounds and subsequent discharge. Called ASL number [phone] to arrange and left message with pt MRN as per instruction. Will report to incoming Night  RN to follow-up 03/23/2019 .

## 2019-03-22 NOTE — ED Provider Notes (Signed)
[facility] Provider Note   CSN: [id number] Arrival date & time: 03/21/19  2336    History   Chief Complaint No chief complaint on file.   HPI Norma FredricksonJ.R. Seebeck is a 43 y.o. male.     Level 5 caveat for acuity condition and deafness.  Bedside translator used.  Patient brought in by EMS with report of central chest pain onset about 1 hour prior to arrival.  The pain is in the center of his chest and does not radiate.  This is associated with some shortness of breath and nausea and diaphoresis.  No vomiting.  He denies having this pain in the past.  EMS gave him aspirin.  There was some ST elevation anteriorly on his EKG and code STEMI was activated. Patient denies any alcohol, tobacco or cocaine use.  Denies any history of cardiac problems.  No leg pain or leg swelling.  No recent fevers, chills, nausea, vomiting or cough. No other country travel. He has not had this kind of pain in the past.  He does not know of anything makes it better or worse.  The history is provided by the patient and the EMS personnel. The history is limited by the condition of the patient and a language barrier.    No past medical history on file.  There are no active problems to display for this patient.   No past surgical history on file.      Home Medications    Prior to Admission medications   Medication Sig Start Date End Date Taking? Authorizing Provider  ibuprofen (ADVIL,MOTRIN) 800 MG tablet Take 1 tablet (800 mg total) by mouth every 8 (eight) hours as needed. 03/06/19   Terrilee FilesButler, Michael C, MD  naproxen (NAPROSYN) 500 MG tablet Take 1 tablet (500 mg total) by mouth 2 (two) times daily. 02/26/19   Janne NapoleonNeese, Hope M, NP  predniSONE (DELTASONE) 20 MG tablet Take 3 tablets (60 mg total) by mouth daily. 03/06/19   Terrilee FilesButler, Michael C, MD    Family History No family history on file.  Social History Social History   Tobacco Use  . Smoking status: Never Smoker  . Smokeless  tobacco: Never Used  Substance Use Topics  . Alcohol use: Not Currently  . Drug use: Not Currently     Allergies   Patient has no known allergies.   Review of Systems Review of Systems  Constitutional: Negative for activity change, appetite change and fever.  HENT: Negative for congestion and rhinorrhea.   Respiratory: Positive for chest tightness and shortness of breath. Negative for cough.   Cardiovascular: Positive for chest pain.  Gastrointestinal: Positive for nausea. Negative for vomiting.  Genitourinary: Negative for dysuria and hematuria.  Musculoskeletal: Negative for arthralgias and myalgias.  Skin: Negative for rash.  Neurological: Positive for weakness. Negative for dizziness and headaches.    all other systems are negative except as noted in the HPI and PMH.    Physical Exam Updated Vital Signs BP 122/73 (BP Location: Left Arm)   Pulse 93   Temp 98.7 F (37.1 C) (Oral)   Resp 17   Ht 5\' 11"  (1.803 m)   Wt 90.7 kg   SpO2 95%   BMI 27.89 kg/m   Physical Exam Vitals signs and nursing note reviewed.  Constitutional:      General: He is not in acute distress.    Appearance: He is well-developed.  HENT:     Head: Normocephalic and atraumatic.  Mouth/Throat:     Pharynx: No oropharyngeal exudate.  Eyes:     Conjunctiva/sclera: Conjunctivae normal.     Pupils: Pupils are equal, round, and reactive to light.  Neck:     Musculoskeletal: Normal range of motion and neck supple.     Comments: No meningismus. Cardiovascular:     Rate and Rhythm: Normal rate and regular rhythm.     Heart sounds: Normal heart sounds. No murmur.     Comments: Equal femoral, DP and PT pulses Equal radial pulses Pulmonary:     Effort: Pulmonary effort is normal. No respiratory distress.     Breath sounds: Normal breath sounds.     Comments: Reproducible chest wall tenderness palpation Chest:     Chest wall: Tenderness present.  Abdominal:     Palpations: Abdomen is soft.      Tenderness: There is no abdominal tenderness. There is no guarding or rebound.  Musculoskeletal: Normal range of motion.        General: No tenderness.  Skin:    General: Skin is warm.  Neurological:     Mental Status: He is alert and oriented to person, place, and time.     Cranial Nerves: No cranial nerve deficit.     Motor: No abnormal muscle tone.     Coordination: Coordination normal.     Comments: No ataxia on finger to nose bilaterally. No pronator drift. 5/5 strength throughout. CN 2-12 intact.Equal grip strength. Sensation intact.   Psychiatric:        Behavior: Behavior normal.      ED Treatments / Results  Labs (all labs ordered are listed, but only abnormal results are displayed) Labs Reviewed  CBC WITH DIFFERENTIAL/PLATELET - Abnormal; Notable for the following components:      Result Value   RBC 4.04 (*)    Hemoglobin 10.4 (*)    HCT 31.8 (*)    MCV 78.7 (*)    MCH 25.7 (*)    All other components within normal limits  COMPREHENSIVE METABOLIC PANEL - Abnormal; Notable for the following components:   Sodium 129 (*)    Chloride 93 (*)    Glucose, Bld 348 (*)    BUN 21 (*)    Calcium 8.7 (*)    Albumin 2.5 (*)    All other components within normal limits  TROPONIN I - Abnormal; Notable for the following components:   Troponin I 0.14 (*)    All other components within normal limits  LIPID PANEL - Abnormal; Notable for the following components:   LDL Cholesterol 113 (*)    All other components within normal limits  D-DIMER, QUANTITATIVE (NOT AT [facility]) - Abnormal; Notable for the following components:   D-Dimer, Quant 1.28 (*)    All other components within normal limits  PROTIME-INR  APTT  RAPID URINE DRUG SCREEN, HOSP PERFORMED  HIV ANTIBODY (ROUTINE TESTING W REFLEX)  T4, FREE  TSH  HEMOGLOBIN A1C    EKG EKG Interpretation  Date/Time:  Sunday March 21 2019 23:43:44 EDT Ventricular Rate:  104 PR Interval:    QRS Duration: 91 QT Interval:   340 QTC Calculation: 448 R Axis:   -54 Text Interpretation:  Sinus tachycardia Abnormal R-wave progression, late transition Inferior infarct, acute (LCx) Lateral leads are also involved >>> Acute MI <<< No previous ECGs available anterior and inferior ST elevation Confirmed by Glynn Octave [phone]) on 03/22/2019 12:07:11 AM   Radiology No results found.  Procedures Procedures (including critical care time)  Medications Ordered in ED Medications  morphine 4 MG/ML injection 4 mg ( Intravenous Automatically Held 03/22/19 0000)  0.9 %  sodium chloride infusion (has no administration in time range)  aspirin chewable tablet 324 mg ( Oral Automatically Held 03/22/19 0000)  heparin injection 4,000 Units ( Intravenous Automatically Held 03/22/19 0000)     Initial Impression / Assessment and Plan / ED Course  I have reviewed the triage vital signs and the nursing notes.  Pertinent labs & imaging results that were available during my care of the patient were reviewed by me and considered in my medical decision making (see chart for details).       Patient with deafness presenting with central chest pain that onset just prior to arrival.  It is reproducible to palpation.  EKG does show central ST elevation however.  Seen at bedside with cardiology Dr. Clarnce Flock.  Patient given aspirin and heparin. Labs obtained.  Pain is reproducible but there is no EKG for comparison. Cardiology will take patient to catheterization lab for emergent catheterization.  Patient to the catheterization lab with cardiology.  After he left, his labs show hyperglycemia without any history of DKA, anemia, elevated d-dimer and elevated troponin.  These were discussed with Dr. Clarnce Flock.  CRITICAL CARE Performed by: Glynn Octave Total critical care time: 35 minutes Critical care time was exclusive of separately billable procedures and treating other patients. Critical care was necessary to treat or prevent imminent or  life-threatening deterioration. Critical care was time spent personally by me on the following activities: development of treatment plan with patient and/or surrogate as well as nursing, discussions with consultants, evaluation of patient's response to treatment, examination of patient, obtaining history from patient or surrogate, ordering and performing treatments and interventions, ordering and review of laboratory studies, ordering and review of radiographic studies, pulse oximetry and re-evaluation of patient's condition.  Final Clinical Impressions(s) / ED Diagnoses   Final diagnoses:  ST elevation myocardial infarction (STEMI), unspecified artery [facility])    ED Discharge Orders    None       Glynn Octave, MD 03/22/19 [phone]

## 2019-03-22 NOTE — Progress Notes (Signed)
Spoke with Shanda Bumps with  ASL  For [employer] [place]. Shanda Bumps will be sending an ASL interpreter at 11 AM to 1 PM 03/23/2019.

## 2019-03-22 NOTE — H&P (Addendum)
Patient ID: Mathew Wallace MRN: [id number], DOB/AGE: December 08, 1976   Admit date: 03/21/2019 Date of Consult: 03/22/2019  Primary Physician: Patient, No Pcp Per Primary Cardiologist: No primary care provider on file.     Past Medical History   No past medical history on file.  No past surgical history on file.   Allergies  No Known Allergies  History of Present Illness  43 year old man who is deaf.  Has no known past medical history.  He is homeless and lives in a homeless shelter here in [place].  He was planning to take the bus this evening and was trying to get a loaded credit card from [employer] to pay for the bus ticket since cash was not accepted.  Developed chest pain last about 1 hour before EMS picked him up.  EMS communicated with the patient through notes.  Patient complained about retrosternal chest pain radiates to his right shoulder.  Never experienced pain like that before.  No shortness of breath.  In ED patient continues to complain of chest pain.  Denies fever chills night sweats.  No cough.  No sick contacts.  No recent travel outside of been as well.  Inpatient Medications     [MAR Hold] aspirin  324 mg Oral Once   [START ON 03/23/2019] aspirin EC  81 mg Oral Daily   atorvastatin  40 mg Oral q1800   [MAR Hold] heparin  4,000 Units Intravenous Once   metoprolol tartrate  12.5 mg Oral BID   [MAR Hold]  morphine injection  4 mg Intravenous Once    Family History    No family history on file. has no family status information on file.    Social History    Social History   Socioeconomic History   Marital status: Single    Spouse name: Not on file   Number of children: Not on file   Years of education: Not on file   Highest education level: Not on file  Occupational History   Not on file  Social Needs   Financial resource strain: Not on file   Food insecurity:    Worry: Not on file    Inability: Not on file   Transportation needs:   Medical: Not on file    Non-medical: Not on file  Tobacco Use   Smoking status: Never Smoker   Smokeless tobacco: Never Used  Substance and Sexual Activity   Alcohol use: Not Currently   Drug use: Not Currently   Sexual activity: Not on file  Lifestyle   Physical activity:    Days per week: Not on file    Minutes per session: Not on file   Stress: Not on file  Relationships   Social connections:    Talks on phone: Not on file    Gets together: Not on file    Attends religious service: Not on file    Active member of club or organization: Not on file    Attends meetings of clubs or organizations: Not on file    Relationship status: Not on file   Intimate partner violence:    Fear of current or ex partner: Not on file    Emotionally abused: Not on file    Physically abused: Not on file    Forced sexual activity: Not on file  Other Topics Concern   Not on file  Social History Narrative   Not on file     Review of Systems    General:  No chills,  fever, night sweats or weight changes.  Cardiovascular:  No chest pain, dyspnea on exertion, edema, orthopnea, palpitations, paroxysmal nocturnal dyspnea. Dermatological: No rash, lesions/masses Respiratory: No cough, dyspnea Urologic: No hematuria, dysuria Abdominal:   No nausea, vomiting, diarrhea, bright red blood per rectum, melena, or hematemesis Neurologic:  No visual changes, wkns, changes in mental status. All other systems reviewed and are otherwise negative except as noted above.  Physical Exam    Height 5\' 11"  (1.803 m), weight 97.5 kg.  General: Pleasant, NAD Psych: Normal affect. Neuro: Alert and oriented X 3. Moves all extremities spontaneously. HEENT: Normal  Neck: Supple without bruits or JVD. Lungs:  Resp regular and unlabored, CTA. Heart: RRR no s3, s4, or murmurs. Abdomen: Soft, non-tender, non-distended, BS + x 4.  Extremities: No clubbing, cyanosis or edema. DP/PT/Radials 2+ and equal  bilaterally.  Labs    Troponin (Point of Care Test) No results for input(s): TROPIPOC in the last 72 hours. No results for input(s): CKTOTAL, CKMB, TROPONINI in the last 72 hours. No results found for: WBC, HGB, HCT, MCV, PLT No results for input(s): NA, K, CL, CO2, BUN, CREATININE, CALCIUM, PROT, BILITOT, ALKPHOS, ALT, AST, GLUCOSE in the last 168 hours.  Invalid input(s): LABALBU No results found for: CHOL, HDL, LDLCALC, TRIG No results found for: [facility]   Radiology Studies    Dg Lumbar Spine Complete  Result Date: 03/06/2019 CLINICAL DATA:  Recent fall with low back pain, initial encounter EXAM: LUMBAR SPINE - COMPLETE 4+ VIEW COMPARISON:  None. FINDINGS: Five lumbar type vertebral bodies are well visualized. There is a partially lumbarized first sacral segment. No pars defects are noted. No anterolisthesis is seen. Mild disc space narrowing at L4-5 and L5-S1 is noted. No acute soft tissue abnormality is noted. IMPRESSION: Mild degenerative changes of lumbar spine without acute abnormality Electronically Signed   By: Alcide CleverMark  Lukens M.D.   On: 03/06/2019 16:20   Dg Wrist Complete Left  Result Date: 02/26/2019 CLINICAL DATA:  43 year old male status post fall landing on the left hand. Left hand pain and swelling. EXAM: LEFT WRIST - COMPLETE 3+ VIEW COMPARISON:  Concurrently obtained radiographs of the left hand FINDINGS: Diffuse soft tissue swelling about the hand and wrist. No acute fracture or malalignment identified. Minimal degenerative change present at the thumb [facility] joint. IMPRESSION: Soft tissue swelling without fracture or malalignment identified. Mild CMC joint osteoarthritis. Electronically Signed   By: Malachy MoanHeath  McCullough M.D.   On: 02/26/2019 16:54   Dg Hand Complete Left  Result Date: 02/26/2019 CLINICAL DATA:  43 year old male with left hand and wrist pain after suffering a fall EXAM: LEFT HAND - COMPLETE 3+ VIEW COMPARISON:  Concurrently obtained radiographs of the wrist  FINDINGS: Extensive soft tissue swelling over the dorsal aspect of the hand. No fracture or malalignment identified. Very mild degenerative change present at the thumb [facility]. IMPRESSION: 1. Diffuse soft tissue swelling without acute fracture or malalignment. 2. 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 Electronically Signed   By: Malachy MoanHeath  McCullough M.D.   On: 02/26/2019 16:55    ECG & Cardiac Imaging    Sinus tach, borderline ST elevation in inferior and lateral leads  Assessment & Plan    STEMI.  Borderline ST elevations.  Ongoing chest pain.  No known past medical history.  Patient is deaf and requires sign language interpreter  Plan -Take to Cath Lab for left heart cath -Heparin bolus given in the ER. -Aspirin full dose given by  EMS  Signed, Macario Golds, MD 03/22/2019, 12:05 AM  For questions or updates, please contact   Please consult [email or web address] for contact info under Cardiology/STEMI.    Patient seen and examined. Agree with assessment and plan.  Mathew Wallace is a 43 year old African-American male who is homeless and currently resides at [facility].  He developed new onset chest tightness this evening while after walking while trying to get a bus ticket.  His pain has persisted and has been ongoing for approximately an hour.  His initial ECG shows sinus tachycardia at 104 bpm with mild 0.5 to 1 mm ST elevation inferiorly.  Troponin 0.14.  A sign language interpreter was used to communicate with the patient.  With his ongoing chest pain and early ST segment changes without a comparison ECG I have recommended definitive cardiac catheterization.  The patient was made aware of the procedure through the interpreter.  In the ER he received 5000 units of heparin immediately prior to coming to the Cath Lab   Lennette Bihari, MD, [facility] 03/22/2019 12:58 AM

## 2019-03-22 NOTE — Discharge Summary (Addendum)
Discharge Summary    Patient ID: Mathew Wallace MRN: [id number]; DOB: 11-20-1976  Admit date: 03/21/2019 Discharge date: 03/23/2019  Primary Care Provider: Patient, No Pcp Per  Primary Cardiologist: Shelva Majestic, MD -New Primary Electrophysiologist:  None   Discharge Diagnoses    Principal Problem:   Chest pain of uncertain etiology Active Problems:   ACS (acute coronary syndrome) ([place])   Uncontrolled type 2 diabetes mellitus with hyperglycemia ([place])   Pulmonary nodules   Elevated troponin   Allergies No Known Allergies  Diagnostic Studies/Procedures    LEFT HEART CATH AND CORONARY ANGIOGRAPHY 03/22/2019  Conclusion    LV end diastolic pressure is normal.  The left ventricular systolic function is normal.  New onset chest pressure with mild troponin elevation and mild 0.5 to 1 mm inferior ST elevation with ongoing chest pain prompting emergent cardiac catheterization.  No significant coronary obstructive disease with a normal left main, a large LAD, large bifurcating ramus immediate vessel, and left circumflex coronary artery with a nondominant RCA.  Preserved LV function without definitive focal segmental wall motion abnormalities. LVEDP 10 mm.  RECOMMENDATION: We will obtain serial enzymes. Check 2D echo Doppler study. Await laboratory. At present we will initiate aspirin 81 mg. Further recommendations to follow. We will try to obtain additional information regarding medical history and medications.    Diagnostic  Dominance: Left    _____________  Echocardiogram 03/22/2019 IMPRESSIONS   1. The left ventricle has normal systolic function with an ejection fraction of 60-65%. The cavity size was normal. There is moderately increased left ventricular wall thickness. Left ventricular diastolic parameters were normal. No evidence of left  ventricular regional wall motion abnormalities.  2. The right ventricle has normal systolic function. The cavity was normal.  There is mildly increased right ventricular wall thickness.  3. No hemodynamically significant valve disease.  FINDINGS  Left Ventricle: The left ventricle has normal systolic function, with an ejection fraction of 60-65%. The cavity size was normal. There is moderately increased left ventricular wall thickness. Left ventricular diastolic parameters were normal. No  evidence of left ventricular regional wall motion abnormalities.. Right Ventricle: The right ventricle has normal systolic function. The cavity was normal. There is mildly increased right ventricular wall thickness. Left Atrium: left atrial size was normal in size   History of Present Illness     Mathew Wallace is a 43 year old man who is deaf.  Had no reported past medical history.  He is homeless and has been staying at a homeless shelter here in [place] for the last 3 months but he reached his max stay and had to move out.  He was planning to take the bus on the evening of 03/21/19 and was trying to get a loaded credit card from [employer] to pay for the bus ticket since cash was not accepted.  Developed chest pain that lasted about 1 hour before EMS picked him up.  EMS communicated with the patient through notes.  Patient complained about retrosternal chest pain that radiated to his right shoulder.  Never experienced pain like that before.  No shortness of breath.  In ED patient continued to complain of chest pain.  Denied fever, chills, night sweats.  No cough.  No sick contacts.  No recent travel outside.  Hospital Course     Consultants: [facility] hospitalist, Dr. Evangeline Gula  EKG showed borderline ST elevations, 0.5-1 mm inferiorly. Troponin was mildly elevated at 0.14. He was given aspirin and IV heparin. He was taken to the cath lab  overnight. LHC showed no significant obstructive coronary artery disease. (see above cath report). He had normal LV function with no definitive focal segmental wall motion abnormalities. Recheck troponin the  following am was not elevated above prior, at 0.15. Echo showed normal LV systolic function with no regional wall motion abnormalities.   Pt had a grossly elevated hemoglobin A1c. Through a ASL interpreter the patient noted that he had hx of diabetes in the past and had been on Metformin. He had lost a lot of weight, 180 lbs, and stopped the Metformin about a year ago. He was seen by the diabetes coordinator for education and recommendation for NPH 6 units BID. He was having money issues and even with his insurance has trouble affording medications.  Pt will be sent home with his vial of NPH and some alcohol and syringes. Pt given glucose meter prior to discharge. He was insured through [employer] which has a case management program and will assign him an outpatient physician (per Dr. Brooke Dare note). The patient will to return to [place] after discharge to live with family.   CT of the chest showed right mediastinal and hilar lymphadenopathy suspicious for malignancy versus infectious process. Negative for PE. Pt denied any symptoms of TB. Per Dr. Evangeline Gula, it is possible that these are reactive pulmonary nodules from another source or due to a malignancy.  These are not acute and can be evaluated as an outpatient.  The patient was aware that the pulmonary nodules are present.  His insurance provider is going to get him a primary care provider where he can follow-up and have these looked into. He was instructed to hold onto his discharge papers as his care everywhere registration number will be present on that and primary care provider can use that to obtain his records.   The patient was monitored over night. His right radial cath site is stable. Pt tolerated insulin well. CM followed up regarding needs prior to discharge. TOC pharmacy was used at the time of discharge to assist with other medications.  Patient has been seen by Dr. Claiborne Billings today and deemed ready for discharge home. All follow up  appointments have been scheduled. Discharge medications are listed below.  _____________  Discharge Vitals Blood pressure 117/83, pulse 82, temperature 98.3 F (36.8 C), temperature source Oral, resp. rate 17, height '5\' 11"'$  (1.803 m), weight 85.1 kg, SpO2 97 %.  Filed Weights   03/21/19 2300 03/21/19 2340 03/23/19 0630  Weight: 97.5 kg 90.7 kg 85.1 kg    Labs & Radiologic Studies    CBC Recent Labs    03/21/19 2345  WBC 7.1  NEUTROABS 4.4  HGB 10.4*  HCT 31.8*  MCV 78.7*  PLT 465   Basic Metabolic Panel Recent Labs    03/21/19 2345 03/22/19 0022  NA 129*  --   K 3.8  --   CL 93*  --   CO2 24  --   GLUCOSE 348*  --   BUN 21*  --   CREATININE 1.05 0.70  CALCIUM 8.7*  --    Liver Function Tests Recent Labs    03/21/19 2345  AST 19  ALT 16  ALKPHOS 68  BILITOT 0.4  PROT 7.6  ALBUMIN 2.5*   No results for input(s): LIPASE, AMYLASE in the last 72 hours. Cardiac Enzymes Recent Labs    03/21/19 2345 03/22/19 0818  TROPONINI 0.14* 0.15*   BNP Invalid input(s): POCBNP D-Dimer Recent Labs    03/21/19 2345  DDIMER 1.28*   Hemoglobin A1C Recent Labs    03/22/19 0306  HGBA1C 11.1*   Fasting Lipid Panel Recent Labs    03/21/19 2345  CHOL 179  HDL 45  LDLCALC 113*  TRIG 107  CHOLHDL 4.0   Thyroid Function Tests Recent Labs    03/22/19 0306  TSH 3.506   _____________  Dg Lumbar Spine Complete  Result Date: 03/06/2019 CLINICAL DATA:  Recent fall with low back pain, initial encounter EXAM: LUMBAR SPINE - COMPLETE 4+ VIEW COMPARISON:  None. FINDINGS: Five lumbar type vertebral bodies are well visualized. There is a partially lumbarized first sacral segment. No pars defects are noted. No anterolisthesis is seen. Mild disc space narrowing at L4-5 and L5-S1 is noted. No acute soft tissue abnormality is noted. IMPRESSION: Mild degenerative changes of lumbar spine without acute abnormality Electronically Signed   By: Inez Catalina M.D.   On: 03/06/2019  16:20   Dg Wrist Complete Left  Result Date: 02/26/2019 CLINICAL DATA:  43 year old male status post fall landing on the left hand. Left hand pain and swelling. EXAM: LEFT WRIST - COMPLETE 3+ VIEW COMPARISON:  Concurrently obtained radiographs of the left hand FINDINGS: Diffuse soft tissue swelling about the hand and wrist. No acute fracture or malalignment identified. Minimal degenerative change present at the thumb [facility] joint. IMPRESSION: Soft tissue swelling without fracture or malalignment identified. Mild CMC joint osteoarthritis. Electronically Signed   By: Jacqulynn Cadet M.D.   On: 02/26/2019 16:54   Ct Angio Chest Pe W Or Wo Contrast  Result Date: 03/22/2019 CLINICAL DATA:  Follow-up on pulmonary embolism therapy. Per chart, no indication of acute pulmonary embolism, rather there is history of STEMI. EXAM: CT ANGIOGRAPHY CHEST WITH CONTRAST TECHNIQUE: Multidetector CT imaging of the chest was performed using the standard protocol during bolus administration of intravenous contrast. Multiplanar CT image reconstructions and MIPs were obtained to evaluate the vascular anatomy. CONTRAST:  17m OMNIPAQUE IOHEXOL 350 MG/ML SOLN COMPARISON:  None. FINDINGS: Cardiovascular: Satisfactory opacification of the pulmonary arteries to the segmental level. No evidence of pulmonary embolism. Normal heart size. No pericardial effusion. Mediastinum/Nodes: Right hilar and paratracheal/precarinal adenopathy. Precarinal node measures up to 2 cm in diameter. No visible cavitation in the arterial phase. Lungs/Pleura: Symmetric micronodular appearance in the apically lungs. The largest nodule measures 7 mm right in the right middle lobe. Mild dependent atelectasis. No Kerley lines, effusion, or pneumothorax. Upper Abdomen: No acute finding Musculoskeletal: No acute finding Call is being placed to the ordering provider-see Z vision documentation Review of the MIP images confirms the above findings. IMPRESSION: 1. Negative  for pulmonary embolism. 2. Right mediastinal and hilar lymphadenopathy needing malignancy workup but potentially infectious/granulomatous (TB, sarcoid, ect) as there is apically predominant micro nodularity. Electronically Signed   By: JMonte FantasiaM.D.   On: 03/22/2019 06:46   Dg Hand Complete Left  Result Date: 02/26/2019 CLINICAL DATA:  43year old male with left hand and wrist pain after suffering a fall EXAM: LEFT HAND - COMPLETE 3+ VIEW COMPARISON:  Concurrently obtained radiographs of the wrist FINDINGS: Extensive soft tissue swelling over the dorsal aspect of the hand. No fracture or malalignment identified. Very mild degenerative change present [facility] the [facility]. IMPRESSION: 1. Diffuse soft tissue swelling without acute fracture or malalignment. 2. 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 Electronically Signed   By: HJacqulynn CadetM.D.   On: 02/26/2019 16:55   Disposition   Pt is being discharged home today  in good condition.  Follow-up Plans & Appointments    Follow-up Information    Primary Care Provider Follow up.   Why:  Please obtain and follow up with a primary care provider for treatment of diabetes and follow up of pulmonary nodules.          Discharge Instructions    Diet - low sodium heart healthy   Complete by:  As directed    Discharge instructions   Complete by:  As directed    Radial Site Care Refer to this sheet in the next few weeks. These instructions provide you with information on caring for yourself after your procedure. Your caregiver may also give you more specific instructions. Your treatment has been planned according to current medical practices, but problems sometimes occur. Call your caregiver if you have any problems or questions after your procedure. HOME CARE INSTRUCTIONS You may shower the day after the procedure.Remove the bandage (dressing) and gently wash the site with plain soap and water.Gently pat the site dry.    Do not apply powder or lotion to the site.  Do not submerge the affected site in water for 3 to 5 days.  Inspect the site at least twice daily.  Do not flex or bend the affected arm for 24 hours.  No lifting over 5 pounds (2.3 kg) for 5 days after your procedure.  Do not drive home if you are discharged the same day of the procedure. Have someone else drive you.  You may drive 24 hours after the procedure unless otherwise instructed by your caregiver.  What to expect: Any bruising will usually fade within 1 to 2 weeks.  Blood that collects in the tissue (hematoma) may be painful to the touch. It should usually decrease in size and tenderness within 1 to 2 weeks.  SEEK IMMEDIATE MEDICAL CARE IF: You have unusual pain at the radial site.  You have redness, warmth, swelling, or pain at the radial site.  You have drainage (other than a small amount of blood on the dressing).  You have chills.  You have a fever or persistent symptoms for more than 72 hours.  You have a fever and your symptoms suddenly get worse.  Your arm becomes pale, cool, tingly, or numb.  You have heavy bleeding from the site. Hold pressure on the site.    You will receive a 30 day supple of your new medication prior to discharge. Please monitor your blood sugars and ensure follow up with a PCP regarding further management once you return to [place].   Increase activity slowly   Complete by:  As directed       Discharge Medications   Allergies as of 03/23/2019   No Known Allergies     Medication List    STOP taking these medications   naproxen 500 MG tablet Commonly known as:  NAPROSYN   predniSONE 20 MG tablet Commonly known as:  DELTASONE     TAKE these medications   aspirin 81 MG EC tablet Take 1 tablet (81 mg total) by mouth daily. Start taking on:  March 24, 2019   atorvastatin 40 MG tablet Commonly known as:  LIPITOR Take 1 tablet (40 mg total) by mouth daily at 6 PM.   ibuprofen 800 MG  tablet Commonly known as:  ADVIL,MOTRIN Take 1 tablet (800 mg total) by mouth every 8 (eight) hours as needed. What changed:  reasons to take this   insulin NPH Human 100 UNIT/ML injection  Commonly known as:  HUMULIN N,NOVOLIN N Inject 0.06 mLs (6 Units total) into the skin 2 (two) times daily at 8 am and 10 pm.   insulin starter kit- pen needles Misc 1 kit by Other route once for 1 dose.   INSULIN SYRINGE .3CC/31GX5/16" 31G X 5/16" 0.3 ML Misc Use twice daily with Insulin   metoprolol tartrate 25 MG tablet Commonly known as:  LOPRESSOR Take 0.5 tablets (12.5 mg total) by mouth 2 (two) times daily.        Acute coronary syndrome (MI, NSTEMI, STEMI, etc) this admission?:  No.  The elevated Troponin was due to the acute medical illness or demand ischemia.    Outstanding Labs/Studies   Needs follow up for lung nodules and diabetes per PCP. Patient instructed regarding follow up prior to discharge.  Duration of Discharge Encounter   Greater than 30 minutes including physician time.  Signed, Reino Bellis, NP 03/23/2019, 12:23 PM   Patient seen and examined. Agree with assessment and plan.  Patient feels well.  Reviewed hospitalization with patient and sign language interpreter.  Right radial site stable from cath.  Okay for discharge today.  The patient tends to try to go back home to [facility].  Discussed with him the significant increase in COVID-19 cases in [place] totaling 130,000.  We discussed distancing.  If he is to travel he should have a mask.   Troy Sine, MD, [facility] 03/23/2019 12:34 PM

## 2019-03-22 NOTE — Progress Notes (Addendum)
Per clarification pt had left [place] 3 months ago and had been living in a homeless shelter up until 2 days ago when his current stay on the shelter has all been used. Dr. Tresa Endo in to see pt. Updated MD , pt is from [place], [place]. Was partying with friends prior to coming to [place].  Mathew Wallace. Attempting to see if he can be helped with transportation back to [place].

## 2019-03-22 NOTE — Progress Notes (Signed)
Progress Note  Patient Name: Mathew Wallace Date of Encounter: 03/22/2019  Primary Cardiologist: Nicki Guadalajara, MD   Subjective   No recurrent chest pain  Inpatient Medications    Scheduled Meds:  aspirin  324 mg Oral Once   [START ON 03/23/2019] aspirin EC  81 mg Oral Daily   atorvastatin  40 mg Oral q1800   heparin  5,000 Units Subcutaneous Q8H   insulin aspart  0-5 Units Subcutaneous QHS   insulin aspart  0-9 Units Subcutaneous TID WC   living well with diabetes book   Does not apply Once   metoprolol tartrate  12.5 mg Oral BID   sodium chloride flush  3 mL Intravenous Q12H   Continuous Infusions:  sodium chloride 20 mL/hr at 03/22/19 0300   sodium chloride     sodium chloride     PRN Meds: sodium chloride, acetaminophen, hydrALAZINE, labetalol, nitroGLYCERIN, ondansetron (ZOFRAN) IV, sodium chloride flush   Vital Signs    Vitals:   03/22/19 0041 03/22/19 0044 03/22/19 0133 03/22/19 0751  BP: 137/90 (!) 151/94 122/73 131/77  Pulse: 98 97 93 94  Resp: 16 (!) 37 17   Temp:   98.7 F (37.1 C)   TempSrc:   Oral   SpO2: 100% 100% 95%   Weight:      Height:        Intake/Output Summary (Last 24 hours) at 03/22/2019 1147 Last data filed at 03/22/2019 0609 Gross per 24 hour  Intake 63.33 ml  Output 1400 ml  Net -1336.67 ml   Last 3 Weights 03/21/2019 03/21/2019 03/06/2019  Weight (lbs) 200 lb 214 lb 15.2 oz 215 lb  Weight (kg) 90.719 kg 97.5 kg 97.523 kg      Telemetry    Sinus - Personally Reviewed  ECG    No new tracings - Personally Reviewed  Physical Exam   GEN: No acute distress.   Neck: No JVD Cardiac: RRR, no murmurs, rubs, or gallops.  Respiratory: Clear to auscultation bilaterally. GI: Soft, nontender, non-distended  MS: No edema; No deformity. Neuro:  Nonfocal  Psych: Normal affect   Labs    Chemistry Recent Labs  Lab 03/21/19 2345  NA 129*  K 3.8  CL 93*  CO2 24  GLUCOSE 348*  BUN 21*  CREATININE 1.05  CALCIUM 8.7*  PROT  7.6  ALBUMIN 2.5*  AST 19  ALT 16  ALKPHOS 68  BILITOT 0.4  GFRNONAA >60  GFRAA >60  ANIONGAP 12     Hematology Recent Labs  Lab 03/21/19 2345  WBC 7.1  RBC 4.04*  HGB 10.4*  HCT 31.8*  MCV 78.7*  MCH 25.7*  MCHC 32.7  RDW 13.7  PLT 325    Cardiac Enzymes Recent Labs  Lab 03/21/19 2345 03/22/19 0818  TROPONINI 0.14* 0.15*   No results for input(s): TROPIPOC in the last 168 hours.   BNPNo results for input(s): BNP, PROBNP in the last 168 hours.   DDimer  Recent Labs  Lab 03/21/19 2345  DDIMER 1.28*     Radiology    Ct Angio Chest Pe W Or Wo Contrast  Result Date: 03/22/2019 CLINICAL DATA:  Follow-up on pulmonary embolism therapy. Per chart, no indication of acute pulmonary embolism, rather there is history of STEMI. EXAM: CT ANGIOGRAPHY CHEST WITH CONTRAST TECHNIQUE: Multidetector CT imaging of the chest was performed using the standard protocol during bolus administration of intravenous contrast. Multiplanar CT image reconstructions and MIPs were obtained to evaluate the vascular anatomy. CONTRAST:  75mL OMNIPAQUE IOHEXOL 350 MG/ML SOLN COMPARISON:  None. FINDINGS: Cardiovascular: Satisfactory opacification of the pulmonary arteries to the segmental level. No evidence of pulmonary embolism. Normal heart size. No pericardial effusion. Mediastinum/Nodes: Right hilar and paratracheal/precarinal adenopathy. Precarinal node measures up to 2 cm in diameter. No visible cavitation in the arterial phase. Lungs/Pleura: Symmetric micronodular appearance in the apically lungs. The largest nodule measures 7 mm right in the right middle lobe. Mild dependent atelectasis. No Kerley lines, effusion, or pneumothorax. Upper Abdomen: No acute finding Musculoskeletal: No acute finding Call is being placed to the ordering provider-see Z vision documentation Review of the MIP images confirms the above findings. IMPRESSION: 1. Negative for pulmonary embolism. 2. Right mediastinal and hilar  lymphadenopathy needing malignancy workup but potentially infectious/granulomatous (TB, sarcoid, ect) as there is apically predominant micro nodularity. Electronically Signed   By: Marnee SpringJonathon  Watts M.D.   On: 03/22/2019 06:46    Cardiac Studies   LEFT HEART CATH AND CORONARY ANGIOGRAPHY 03/22/2019  Conclusion    LV end diastolic pressure is normal.  The left ventricular systolic function is normal.   New onset chest pressure with mild troponin elevation and mild 0.5 to 1 mm inferior ST elevation with ongoing chest pain prompting emergent cardiac catheterization.  No significant coronary obstructive disease with a normal left main, a large LAD, large bifurcating ramus immediate vessel, and left circumflex coronary artery with a nondominant RCA.  Preserved LV function without definitive focal segmental wall motion abnormalities.  LVEDP 10 mm.  RECOMMENDATION: We will obtain serial enzymes.  Check 2D echo Doppler study.  Await laboratory.  At present we will initiate aspirin 81 mg.  Further recommendations to follow.  We will try to obtain additional information regarding medical history and medications.     Patient Profile     43 y.o. male who is deaf and homeless. No past medical history. Presented with chest pain and borderline ST elevations. Troponin 0.14. Cath showed no CAD.   Assessment & Plan    Chest pain -Troponin mildly elevated at 0.14. Pt with no CAD on cath overnight. Will check troponin this am for trend. -Pt was started on aspirin, statin, beta blocker.  -LDL 113, TC 179, HDL 45.  -A1c 11.1. -Further recommendations per Dr. Tresa EndoKelly.   Diabetes Mellitus -No apparent history of diabetes. Blood sugar elevated. A1c 11.1.  -Will initiate blood sugar checks AC/HS with sensitive SSI.  -Consult to diabetes coordinator placed.   For questions or updates, please contact [facility] Please consult [email or web address] for contact info under       Signed, Berton BonJanine  Hammond 03/22/2019, 11:47 AM       Patient seen and examined. Agree with assessment and plan.  No recurrent chest pain.  The sign language interpreter was in the room when I saw the patient today.  Apparently the patient had weight up to 380 pounds and over the last several years lost 180 pounds.  He had been on metformin for diabetes and stopped taking this with his weight loss.  Reportedly, [place] in [place] in about 3 months ago came to the [place] area.  He has been residing at [facility].  His 3354-month stay is up and he was in the process of going [place] the bus station [place] evening when he developed a significant chest and arm discomfort.  I reviewed the catheterization findings with the patient.  Right radial access site is stable.  Hemoglobin A1c is 11.1.  Diabetic  nurses and with patient presently.  We will asked hospitalist service to see patient in follow-up his diabetes as well as his chest x-ray abnormalities.  Mild microcytic anemia.  Hyponatremia, and hypoalbuminemia.   Lennette Bihari, MD, [facility] 03/22/2019 12:01 PM

## 2019-03-22 NOTE — ED Notes (Signed)
Radiolucent pad placed prior to transport to cath lab

## 2019-03-23 DIAGNOSIS — R0789 Other chest pain: Secondary | ICD-10-CM

## 2019-03-23 LAB — GLUCOSE, CAPILLARY: Glucose-Capillary: 194 mg/dL — ABNORMAL HIGH (ref 70–99)

## 2019-03-23 MED ORDER — INSULIN NPH (HUMAN) (ISOPHANE) 100 UNIT/ML ~~LOC~~ SUSP: 6.0000 [IU] | Freq: Two times a day (BID) | SUBCUTANEOUS | 11 refills | Status: AC

## 2019-03-23 MED ORDER — ATORVASTATIN CALCIUM 40 MG PO TABS: 40.0000 mg | ORAL_TABLET | Freq: Every day | ORAL | 0 refills | Status: AC

## 2019-03-23 MED ORDER — "INSULIN SYRINGE 31G X 5/16"" 0.3 ML MISC": 0 refills | Status: AC

## 2019-03-23 MED ORDER — INSULIN STARTER KIT- SYRINGES (ENGLISH): 1.0000 | Freq: Once | Status: DC

## 2019-03-23 MED ORDER — ASPIRIN 81 MG PO TBEC: 81.0000 mg | DELAYED_RELEASE_TABLET | Freq: Every day | ORAL | 0 refills | Status: AC

## 2019-03-23 MED ORDER — INSULIN STARTER KIT- PEN NEEDLES (ENGLISH): 1.0000 | Freq: Once | 0 refills | Status: AC

## 2019-03-23 MED ORDER — METOPROLOL TARTRATE 25 MG PO TABS: 12.5000 mg | ORAL_TABLET | Freq: Two times a day (BID) | ORAL | 0 refills | Status: AC

## 2019-03-23 MED FILL — ULTICARE INS 0.3 ML 30GX1/2: 30G X 1/2" | 50 days supply | Qty: 100 | Fill #0

## 2019-03-23 MED FILL — ATORVASTATIN CALCIUM 40 MG: 40 | 30 days supply | Qty: 30 | Fill #0

## 2019-03-23 MED FILL — ASPIRIN LOW DOSE 81 MG TBEC: 81 | 30 days supply | Qty: 30 | Fill #0

## 2019-03-23 MED FILL — METOPROLOL TARTRATE 25 MG T: 25 | 30 days supply | Qty: 30 | Fill #0

## 2019-03-23 MED FILL — Verapamil HCl IV Soln 2.5 MG/ML: INTRAVENOUS | Qty: 2 | Status: AC

## 2019-03-23 MED FILL — Heparin Sodium (Porcine) Inj 1000 Unit/ML: INTRAMUSCULAR | Qty: 10 | Status: AC

## 2019-03-23 MED FILL — Nitroglycerin IV Soln 100 MCG/ML in D5W: INTRA_ARTERIAL | Qty: 10 | Status: AC

## 2019-03-23 NOTE — Discharge Instructions (Signed)
Please keep this discharge paperwork with Care Everywhere registration number the primary care provider can use to obtain your hospital records.

## 2019-03-23 NOTE — Progress Notes (Signed)
[facility]  Patient evaluated today. Agree with Dr Albin Fischer plan per notes from yesterday. He is being discharged today by primary service.   Calvert Cantor, MD

## 2019-03-23 NOTE — TOC Transition Note (Signed)
Transition of Care [facility]) - CM/SW Discharge Note   Patient Details  Name: Mathew Wallace MRN: [id number] Date of Birth: Aug 15, 1976  [facility] CM/SW Contact:  Bethena Roys, RN Phone Number: 03/23/2019, 10:13 AM   Clinical Narrative:  Pt presented for chest pain. Pt is deaf and communicated that he is homeless. CM did speak with patient and interpreter- pt states he will transition back to [facility] CM did ask if needs primary care provider. Patient states he will follow up with provider when he returns to [facility]. Pt will receive his bottle of NPH at transition. If patient can get the educational kit- needles should be inside. No further needs from CM at this time.     Final next level of care: Home/Self Care Barriers to Discharge: No Barriers Identified   Patient Goals and CMS Choice   CMS Medicare.gov Compare Post Acute Care list provided to:: (S) (N/A) Choice offered to / list presented to : NA   Discharge Plan and Services In-house Referral: Clinical Social Work Discharge Planning Services: CM Consult Post Acute Care Choice: NA          DME Arranged: N/A DME Agency: NA HH Arranged: NA HH Agency: NA   Readmission Risk Interventions No flowsheet data found.

## 2020-01-25 IMAGING — DX LEFT HAND - COMPLETE 3+ VIEW
3 series · 3 of 3 positions shown · non-contrast
Comparison: Concurrently obtained radiographs of the wrist

CLINICAL DATA: 42-year-old male with left hand and wrist pain after
suffering a fall

EXAM:
LEFT HAND - COMPLETE 3+ VIEW

[hand pa]
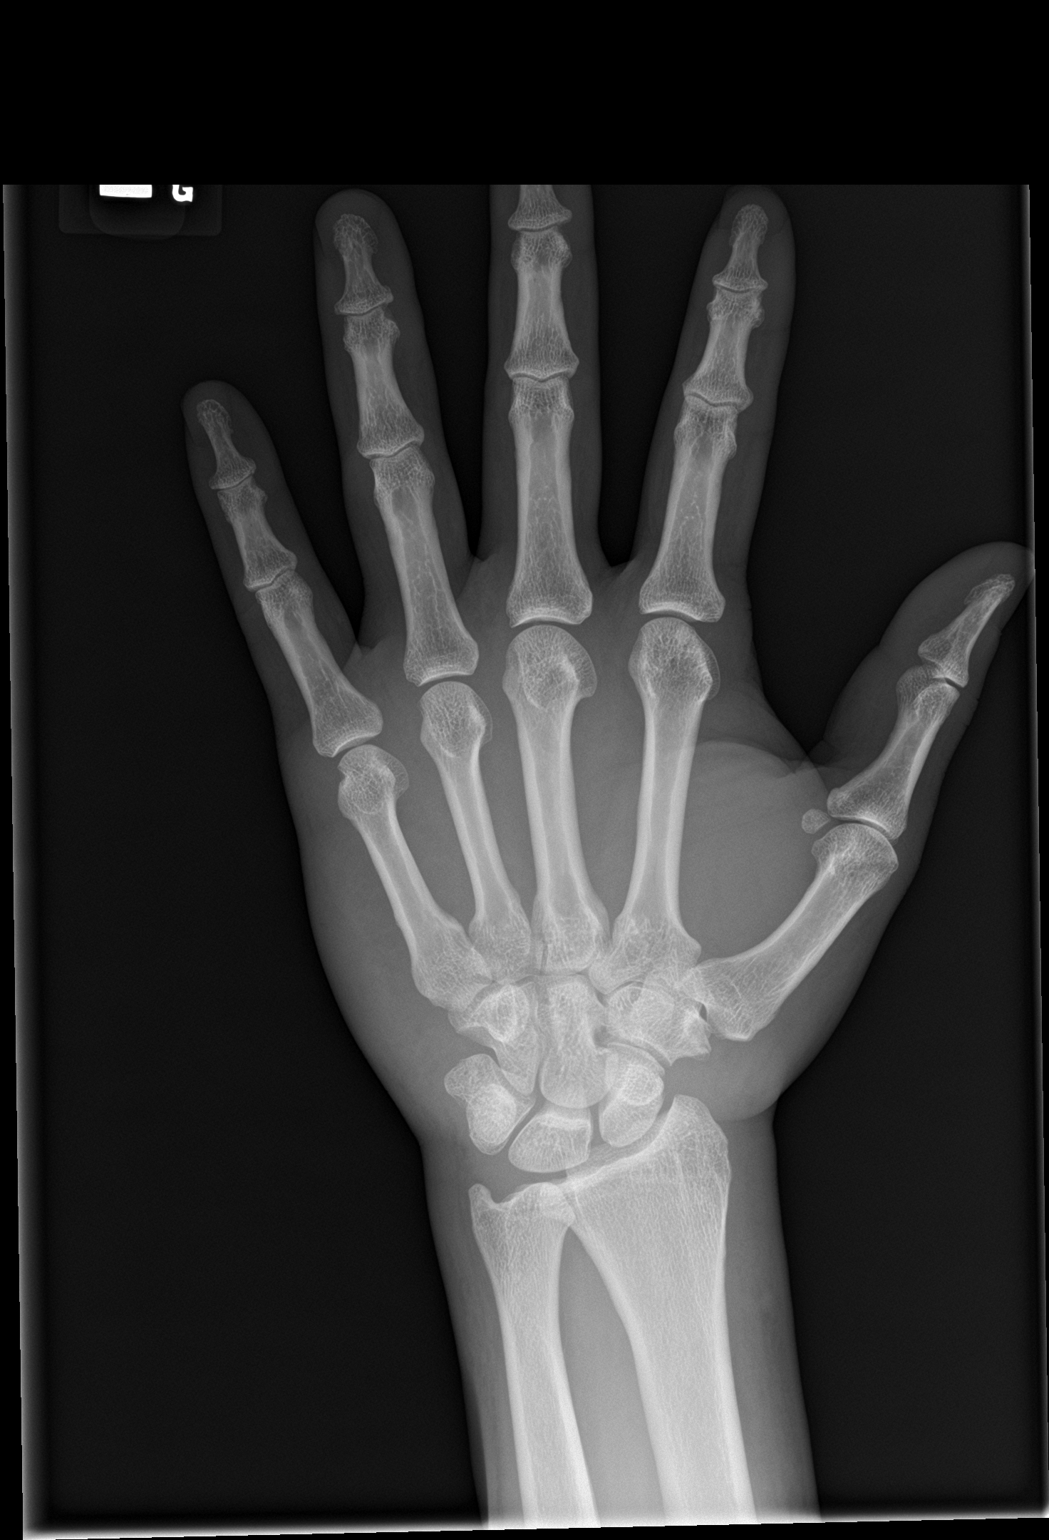

[hand obl]
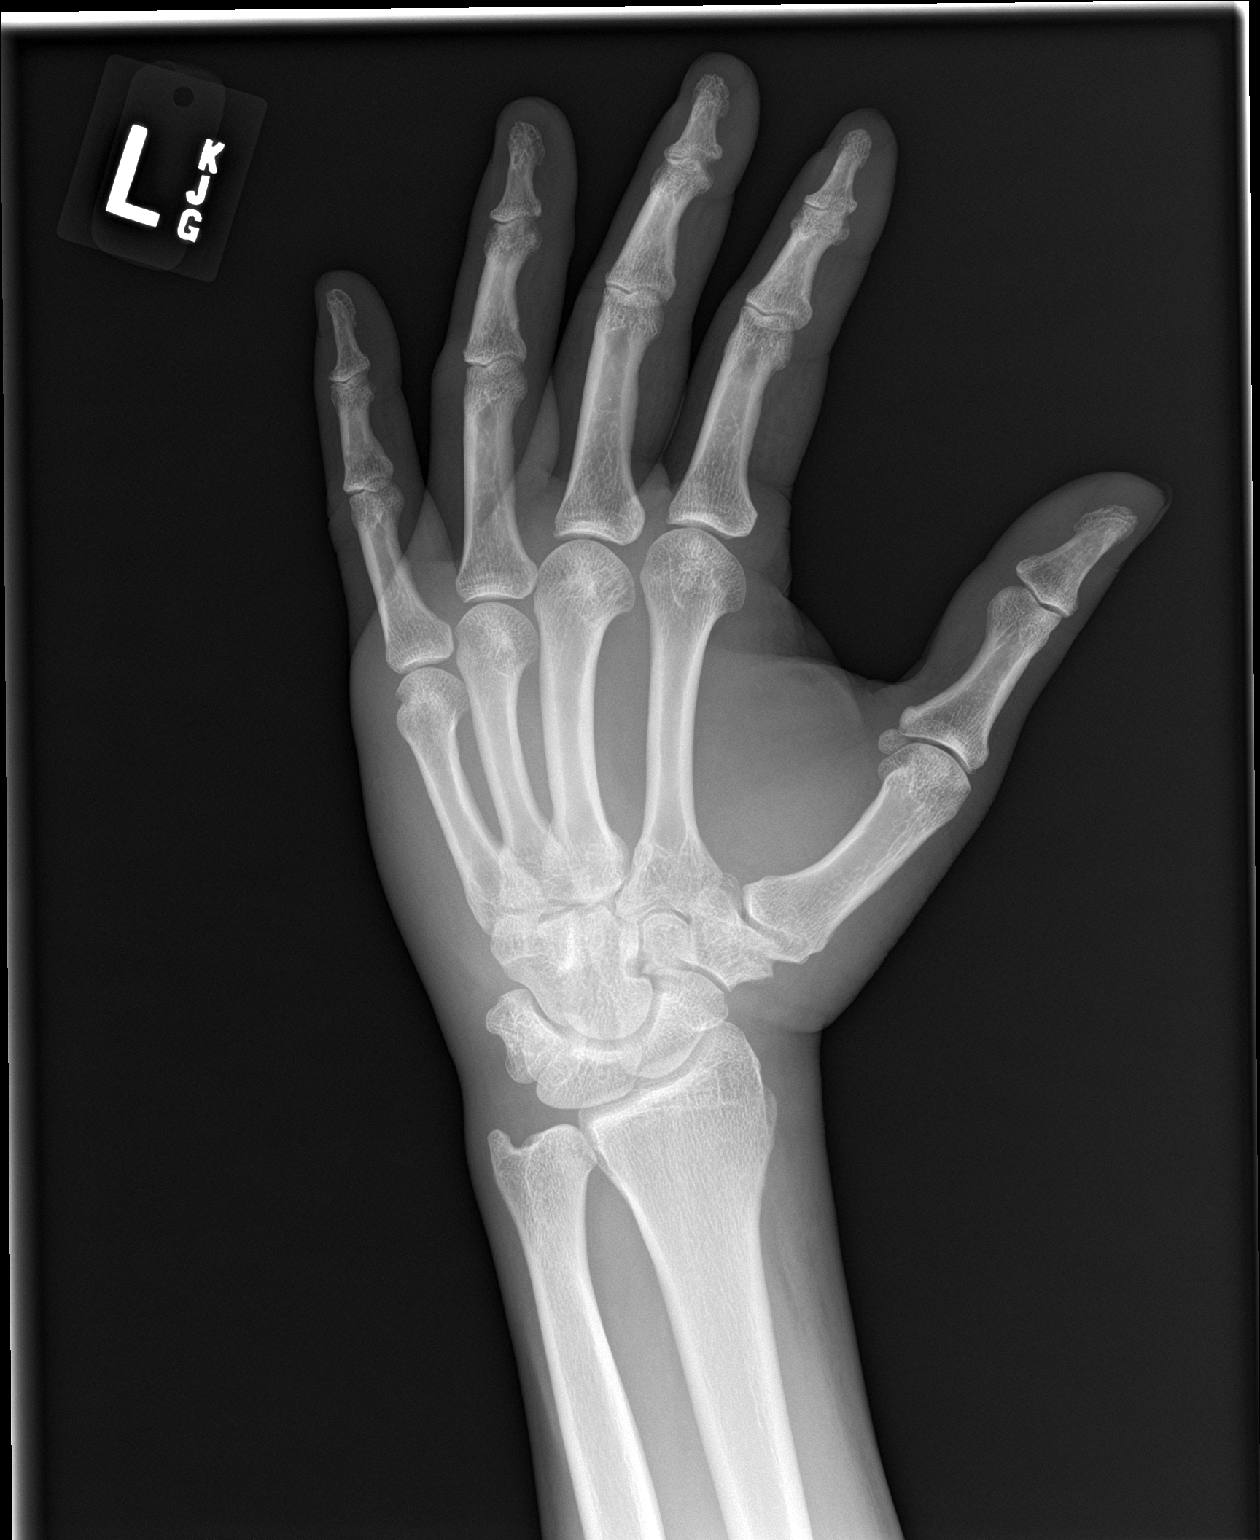

[hand lat]
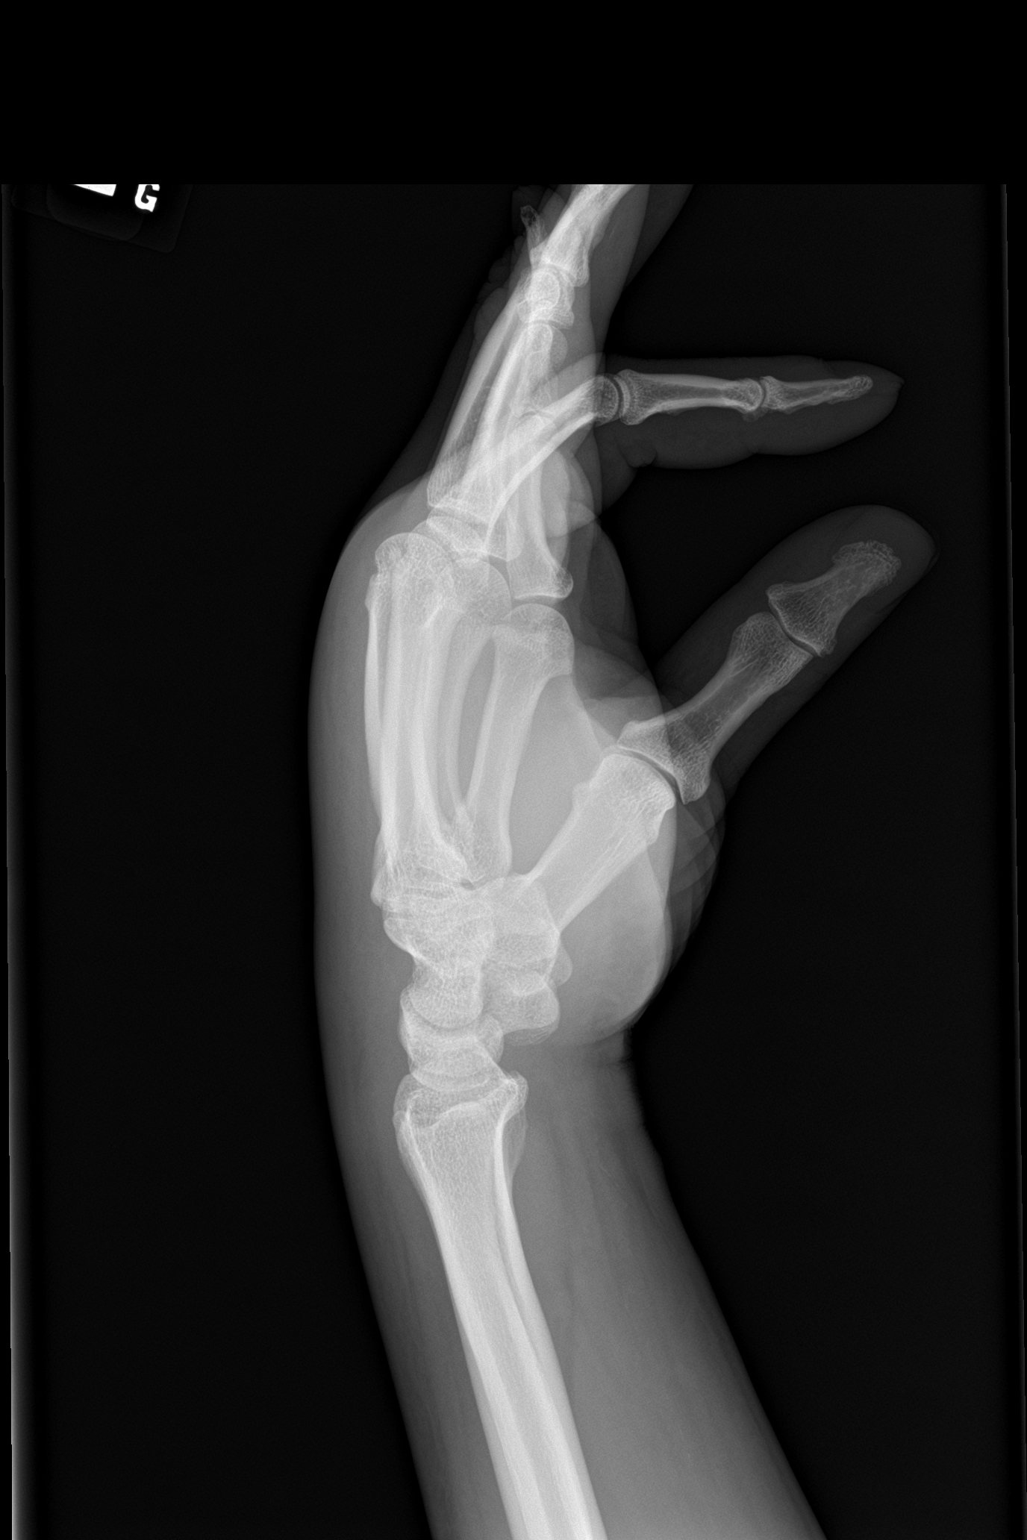

[3 of 3 positions shown; findings below may reference images not displayed]

FINDINGS: Extensive soft tissue swelling over the dorsal aspect of the hand.
No fracture or malalignment identified. Very mild degenerative
change present at the thumb CMC joint.
IMPRESSION: 1. Diffuse soft tissue swelling without acute fracture or
malalignment.
2. Mild CMC joint osteoarthritis at the base of the thumb.

## 2020-02-18 IMAGING — CT CT ANGIOGRAPHY CHEST
2 of 7 series · 17 of 46 positions shown · IV contrast (APPLIED)
Comparison: None.

CLINICAL DATA: Follow-up on pulmonary embolism therapy. Per chart,
no indication of acute pulmonary embolism, rather there is history
of STEMI.

EXAM:
CT ANGIOGRAPHY CHEST WITH CONTRAST
TECHNIQUE: Multidetector CT imaging of the chest was performed using the
standard protocol during bolus administration of intravenous
contrast. Multiplanar CT image reconstructions and MIPs were
obtained to evaluate the vascular anatomy.
CONTRAST:  75mL OMNIPAQUE IOHEXOL 350 MG/ML SOLN

[Series 7: thins · axial · 0.75mm/px · z∈[+1091,+1291]mm · 14 of 322 slices shown]
[im 18/322  lung]
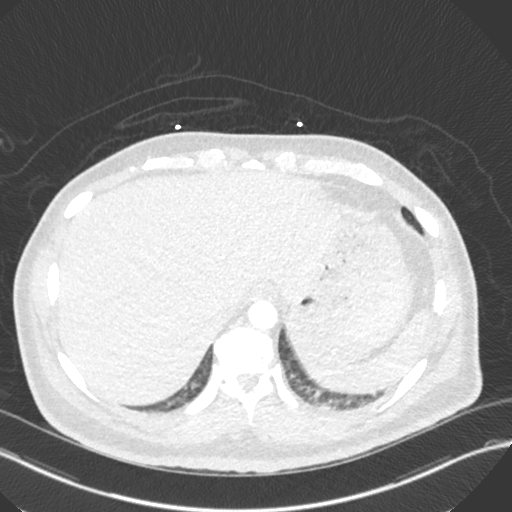
[im 36/322  soft-tissue]
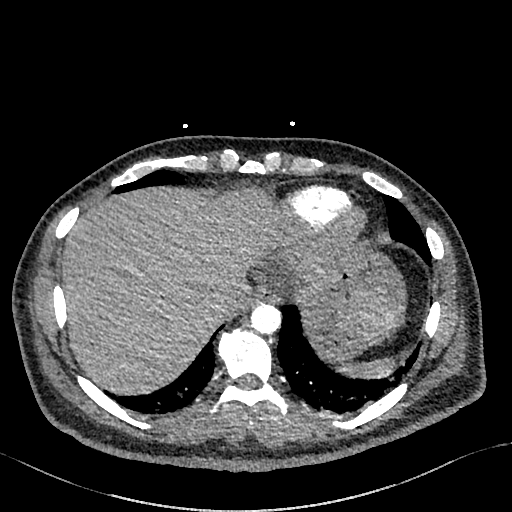
[im 72/322  lung]
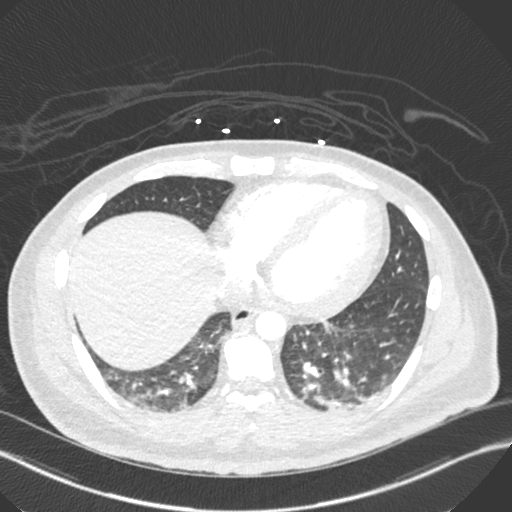
[im 90/322  soft-tissue]
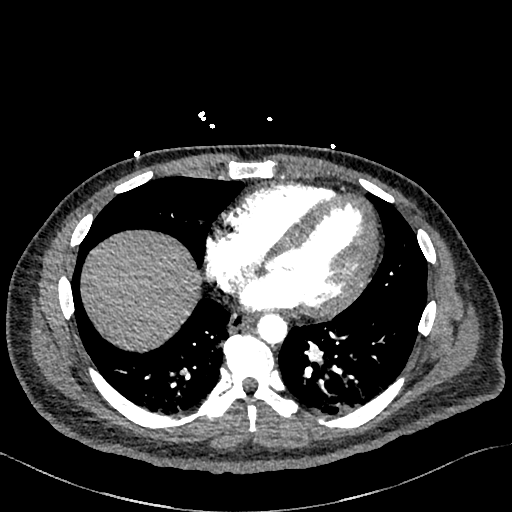
[im 108/322  lung]
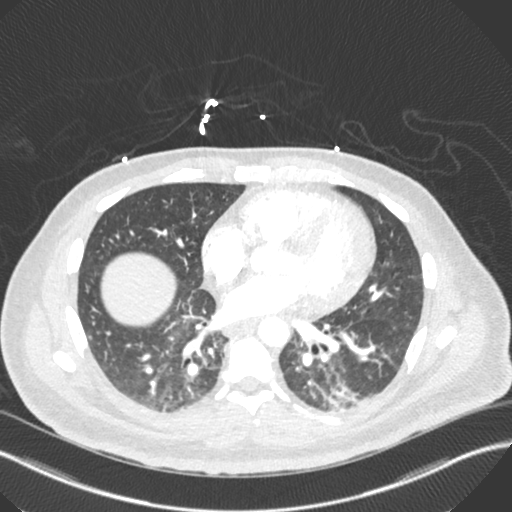
[im 125/322  soft-tissue]
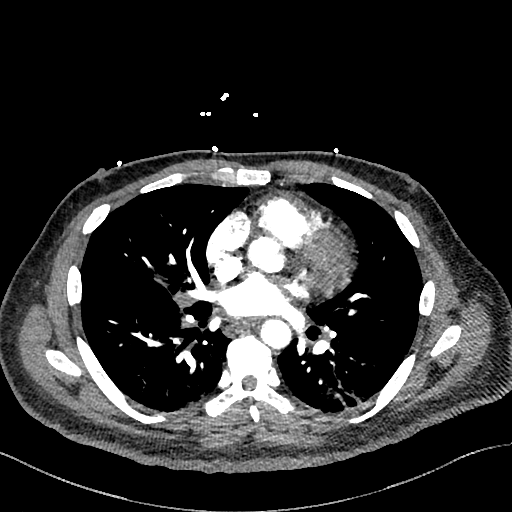
[im 143/322  lung]
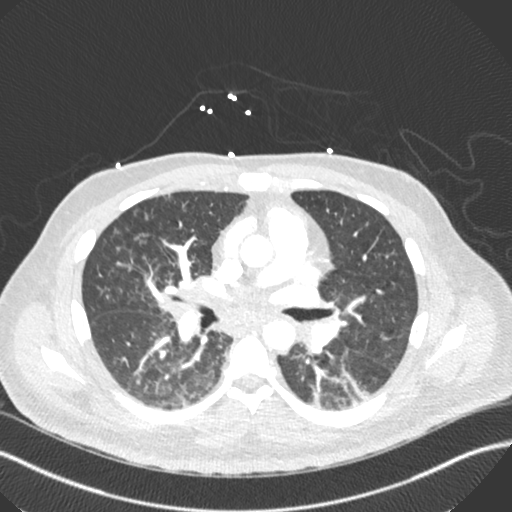
[im 179/322  soft-tissue]
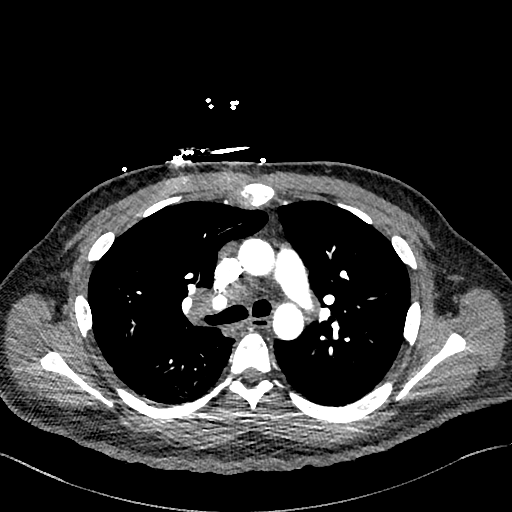
[im 197/322  lung]
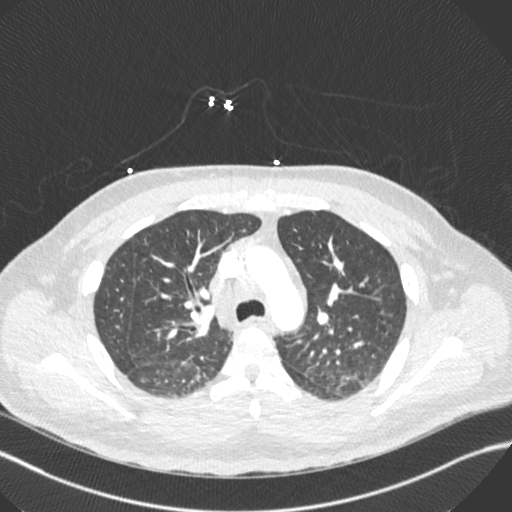
[im 215/322  soft-tissue]
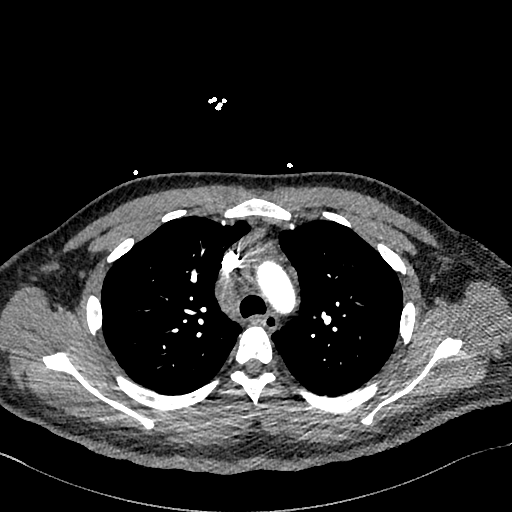
[im 232/322  lung]
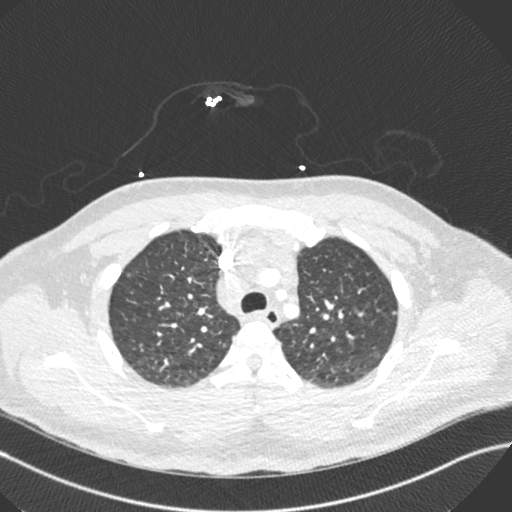
[im 250/322  soft-tissue]
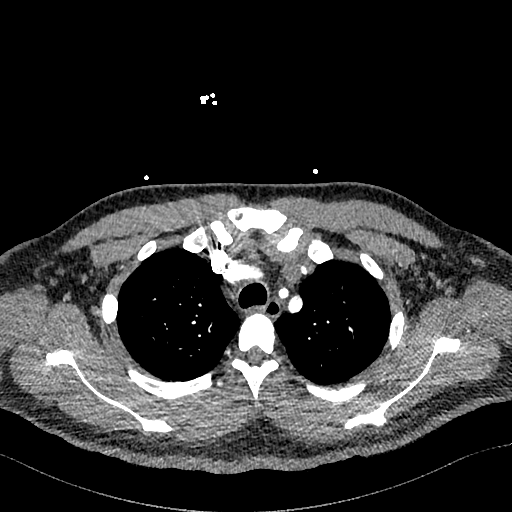
[im 286/322  lung]
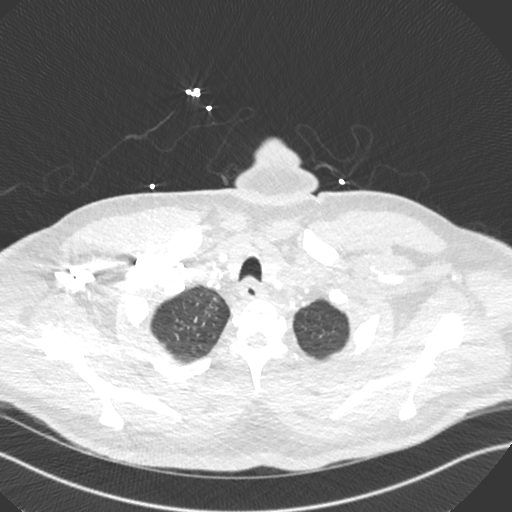
[im 304/322  soft-tissue]
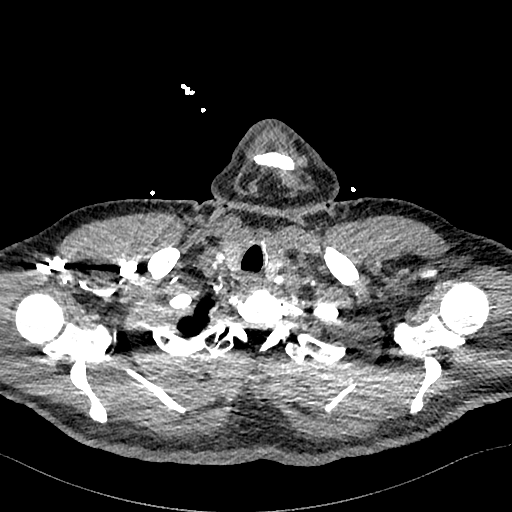

[Series 8: cor · coronal · 0.45mm/px · 3 of 116 slices shown]
[im 29/116  soft-tissue]
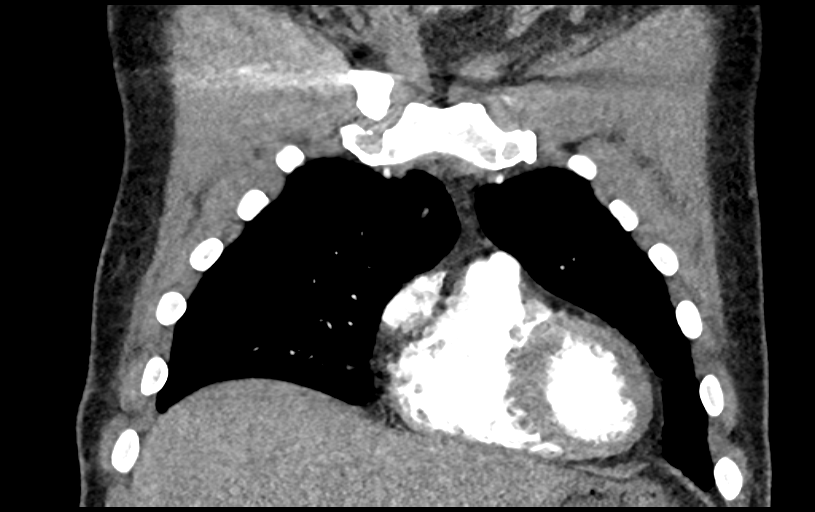
[im 58/116  soft-tissue]
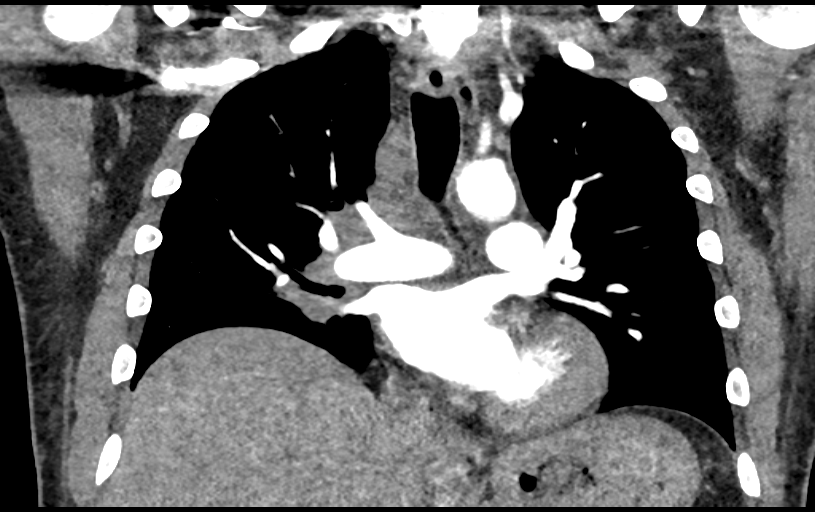
[im 87/116  soft-tissue]
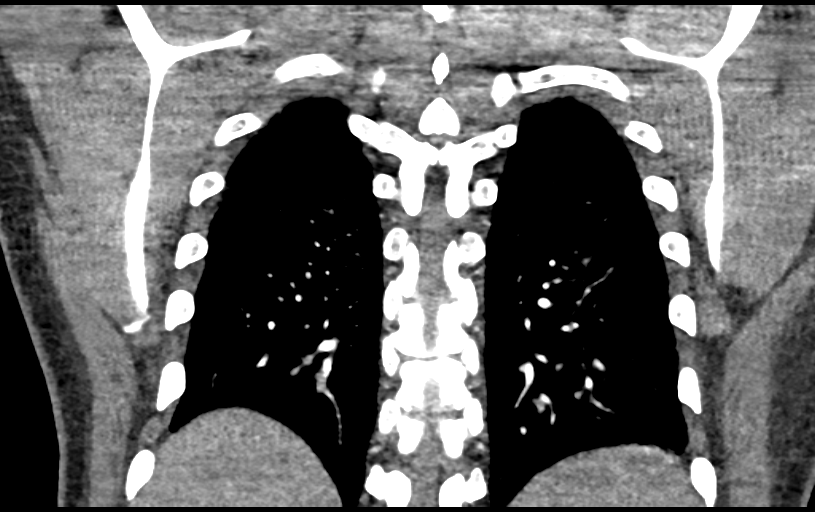

[17 of 46 positions shown; findings below may reference images not displayed]

FINDINGS: Cardiovascular: Satisfactory opacification of the pulmonary arteries
to the segmental level. No evidence of pulmonary embolism. Normal
heart size. No pericardial effusion.

Mediastinum/Nodes: Right hilar and paratracheal/precarinal
adenopathy. Precarinal node measures up to 2 cm in diameter. No
visible cavitation in the arterial phase.

Lungs/Pleura: Symmetric micronodular appearance in the apically
lungs. The largest nodule measures 7 mm right in the right middle
lobe. Mild dependent atelectasis. No Kerley lines, effusion, or
pneumothorax.

Upper Abdomen: No acute finding

Musculoskeletal: No acute finding

Call is being placed to the ordering provider-see [REDACTED]
documentation

Review of the MIP images confirms the above findings.
IMPRESSION: 1. Negative for pulmonary embolism.
2. Right mediastinal and hilar lymphadenopathy needing malignancy
workup but potentially infectious/granulomatous (TB, sarcoid, ect)
as there is apically predominant micro nodularity.
